# Patient Record
Sex: Female | Born: 1960
Health system: Southern US, Community
[De-identification: ages and names within clinical notes are randomized; demographics above are authoritative.]

## PROBLEM LIST (undated history)

## (undated) DIAGNOSIS — E785 Hyperlipidemia, unspecified: Secondary | ICD-10-CM

## (undated) DIAGNOSIS — M79609 Pain in unspecified limb: Secondary | ICD-10-CM

## (undated) DIAGNOSIS — D509 Iron deficiency anemia, unspecified: Secondary | ICD-10-CM

## (undated) DIAGNOSIS — R740 Nonspecific elevation of levels of transaminase and lactic acid dehydrogenase [LDH]: Secondary | ICD-10-CM

## (undated) DIAGNOSIS — J309 Allergic rhinitis, unspecified: Secondary | ICD-10-CM

## (undated) HISTORY — DX: Allergic rhinitis, unspecified: J30.9

## (undated) HISTORY — DX: Nonspecific elevation of levels of transaminase and lactic acid dehydrogenase (ldh): R74.0

## (undated) HISTORY — DX: Pain in unspecified limb: M79.609

## (undated) HISTORY — DX: Hyperlipidemia, unspecified: E78.5

## (undated) HISTORY — DX: Iron deficiency anemia, unspecified: D50.9

---

## 1992-04-12 HISTORY — PX: OTHER SURGICAL HISTORY: SHX169

## 1996-04-12 HISTORY — PX: OOPHORECTOMY: SHX86

## 2004-04-04 ENCOUNTER — Emergency Department (HOSPITAL_COMMUNITY): Admission: EM | Admit: 2004-04-04 | Discharge: 2004-04-04 | Payer: Self-pay | Admitting: Family Medicine

## 2005-09-15 ENCOUNTER — Other Ambulatory Visit: Admission: RE | Admit: 2005-09-15 | Discharge: 2005-09-15 | Payer: Self-pay | Admitting: Obstetrics and Gynecology

## 2006-06-06 ENCOUNTER — Ambulatory Visit: Payer: Self-pay | Admitting: Internal Medicine

## 2006-06-06 LAB — CONVERTED CEMR LAB
Alkaline Phosphatase: 65 units/L (ref 39–117)
BUN: 11 mg/dL (ref 6–23)
Basophils Relative: 0.4 % (ref 0.0–1.0)
Bilirubin Urine: NEGATIVE
CO2: 28 meq/L (ref 19–32)
Calcium: 9.5 mg/dL (ref 8.4–10.5)
Eosinophils Absolute: 0.2 10*3/uL (ref 0.0–0.6)
Eosinophils Relative: 4.2 % (ref 0.0–5.0)
GFR calc Af Amer: 62 mL/min
GFR calc non Af Amer: 51 mL/min
Hemoglobin, Urine: NEGATIVE
Hemoglobin: 12.6 g/dL (ref 12.0–15.0)
Lymphocytes Relative: 45.1 % (ref 12.0–46.0)
MCHC: 32.9 g/dL (ref 30.0–36.0)
MCV: 87.1 fL (ref 78.0–100.0)
Monocytes Absolute: 0.4 10*3/uL (ref 0.2–0.7)
Monocytes Relative: 10.9 % (ref 3.0–11.0)
Neutrophils Relative %: 39.4 % — ABNORMAL LOW (ref 43.0–77.0)
Nitrite: NEGATIVE
Potassium: 4.9 meq/L (ref 3.5–5.1)
TSH: 2 microintl units/mL (ref 0.35–5.50)
Total CHOL/HDL Ratio: 3.2
WBC: 3.9 10*3/uL — ABNORMAL LOW (ref 4.5–10.5)

## 2006-06-10 ENCOUNTER — Ambulatory Visit: Payer: Self-pay | Admitting: Internal Medicine

## 2009-10-22 ENCOUNTER — Ambulatory Visit: Payer: Self-pay | Admitting: Internal Medicine

## 2009-10-22 DIAGNOSIS — D509 Iron deficiency anemia, unspecified: Secondary | ICD-10-CM

## 2009-10-22 DIAGNOSIS — R740 Nonspecific elevation of levels of transaminase and lactic acid dehydrogenase [LDH]: Secondary | ICD-10-CM

## 2009-10-22 DIAGNOSIS — M79609 Pain in unspecified limb: Secondary | ICD-10-CM

## 2009-10-22 DIAGNOSIS — R7401 Elevation of levels of liver transaminase levels: Secondary | ICD-10-CM

## 2009-10-22 HISTORY — DX: Iron deficiency anemia, unspecified: D50.9

## 2009-10-22 HISTORY — DX: Elevation of levels of liver transaminase levels: R74.01

## 2009-10-22 HISTORY — DX: Pain in unspecified limb: M79.609

## 2009-10-28 ENCOUNTER — Encounter: Payer: Self-pay | Admitting: Internal Medicine

## 2009-10-30 ENCOUNTER — Encounter: Admission: RE | Admit: 2009-10-30 | Discharge: 2009-10-30 | Payer: Self-pay | Admitting: Internal Medicine

## 2010-05-03 ENCOUNTER — Encounter: Payer: Self-pay | Admitting: Obstetrics and Gynecology

## 2010-05-10 LAB — CONVERTED CEMR LAB
ALT: 20 units/L (ref 0–35)
AST: 22 units/L (ref 0–37)
Albumin: 3.4 g/dL — ABNORMAL LOW (ref 3.5–5.2)
Alkaline Phosphatase: 63 units/L (ref 39–117)
Bilirubin Urine: NEGATIVE
CO2: 26 meq/L (ref 19–32)
Creatinine, Ser: 1.1 mg/dL (ref 0.4–1.2)
Eosinophils Absolute: 0.1 10*3/uL (ref 0.0–0.7)
GFR calc non Af Amer: 71.48 mL/min (ref >60)
HCT: 41 % (ref 36.0–46.0)
Hep A IgM: NEGATIVE
Hep B C IgM: NEGATIVE
Ketones, ur: NEGATIVE mg/dL
LDL Cholesterol: 96 mg/dL (ref 0–99)
Lymphs Abs: 2.2 10*3/uL (ref 0.7–4.0)
MCHC: 33.8 g/dL (ref 30.0–36.0)
MCV: 87 fL (ref 78.0–100.0)
Monocytes Absolute: 0.3 10*3/uL (ref 0.1–1.0)
Sodium: 141 meq/L (ref 135–145)
TSH: 0.7 microintl units/mL (ref 0.35–5.50)
Total Protein: 5.5 g/dL — ABNORMAL LOW (ref 6.0–8.3)
Triglycerides: 54 mg/dL (ref 0.0–149.0)
Urine Glucose: NEGATIVE mg/dL
VLDL: 10.8 mg/dL (ref 0.0–40.0)

## 2010-05-12 NOTE — Assessment & Plan Note (Signed)
Summary: NEW PT CPX/ TO COME FASTING / LOV 05-2006 /NWS   Vital Signs:  Patient profile:   50 year old female Height:      68 inches Weight:      147 pounds BMI:     22.43 O2 Sat:      99 % on Room air Temp:     98.3 degrees F oral Pulse rate:   77 / minute BP sitting:   100 / 78  (left arm) Cuff size:   regular  Vitals Entered By: Zella Ball Ewing CMA Duncan Dull) (October 22, 2009 9:23 AM)  O2 Flow:  Room air  CC: Adult Physical/RE   CC:  Adult Physical/RE.  History of Present Illness: overall doing well, Pt denies CP, sob, doe, wheezing, orthopnea, pnd, worsening LE edema, palps, dizziness or syncope  Pt denies new neuro symptoms such as headache, facial or extremity weakness  Wants to quit smoking.  Preventive Screening-Counseling & Management  Alcohol-Tobacco     Smoking Status: current      Drug Use:  no.    Problems Prior to Update: 1)  Preventive Health Care  (ICD-V70.0) 2)  Thumb Pain, Right  (ICD-729.5) 3)  Transaminases, Serum, Elevated  (ICD-790.4) 4)  Anemia-iron Deficiency  (ICD-280.9)  Medications Prior to Update: 1)  None  Current Medications (verified): 1)  Chantix Starting Month Pak 0.5 Mg X 11 & 1 Mg X 42 Tabs (Varenicline Tartrate) .... Use Asd 1 By Mouth Once Daily  Allergies (verified): No Known Drug Allergies  Past History:  Family History: Last updated: 10/22/2009 brother with lung cancer father with esophageal cancer mother and sister with DM, HTN  Social History: Last updated: 10/22/2009 Divorced no children work - Manufacturing systems engineer Current Smoker Alcohol use-yes Drug use-no  Risk Factors: Smoking Status: current (10/22/2009)  Past Medical History: Anemia-iron deficiency LMP feb 2008 - early menopause  Past Surgical History: Oophorectomy - right only , 1998 s/p fibroid surgury (no TAH)  1994  Family History: Reviewed history and no changes required. brother with lung cancer father with esophageal cancer mother and  sister with DM, HTN  Social History: Reviewed history and no changes required. Divorced no children work - Manufacturing systems engineer Current Smoker Alcohol use-yes Drug use-no Smoking Status:  current Drug Use:  no  Review of Systems  The patient denies anorexia, fever, weight loss, weight gain, vision loss, decreased hearing, hoarseness, chest pain, syncope, dyspnea on exertion, peripheral edema, prolonged cough, headaches, hemoptysis, abdominal pain, melena, hematochezia, severe indigestion/heartburn, hematuria, muscle weakness, suspicious skin lesions, transient blindness, difficulty walking, depression, unusual weight change, abnormal bleeding, enlarged lymph nodes, and angioedema.         all otherwise negative per pt -  except for pain with the right thumb DIP  - seems to lock and then hurts to put it back into place ;  cant flex the DIP for one month; also has occasional tingling of the fingers to both hands at work  Physical Exam  General:  alert and well-developed.   Head:  normocephalic and atraumatic.   Eyes:  vision grossly intact, pupils equal, and pupils round.   Ears:  R ear normal and L ear normal.   Nose:  no external deformity and no nasal discharge.   Mouth:  no gingival abnormalities and pharynx pink and moist.   Neck:  supple and no masses.   Lungs:  normal respiratory effort and normal breath sounds.   Heart:  normal rate and regular rhythm.   Abdomen:  soft, non-tender, and normal bowel sounds.   Msk:  no joint tenderness and no joint swelling. , l;eft thumb with unable to flex the DIP  Extremities:  no edema, no erythema  Neurologic:  cranial nerves II-XII intact and strength normal in all extremities.   Skin:  color normal and no rashes.   Psych:  memory intact for recent and remote and normally interactive.     Impression & Recommendations:  Problem # 1:  Preventive Health Care (ICD-V70.0)  Overall doing well, age appropriate education and counseling  updated and referral for appropriate preventive services done unless declined, immunizations up to date or declined, diet counseling done if overweight, urged to quit smoking if smokes , most recent labs reviewed and current ordered if appropriate, ecg reviewed or declined (interpretation per ECG scanned in the EMR if done); information regarding Medicare Prevention requirements given if appropriate; speciality referrals updated as appropriate   Orders: TLB-BMP (Basic Metabolic Panel-BMET) (80048-METABOL) TLB-CBC Platelet - w/Differential (85025-CBCD) TLB-Hepatic/Liver Function Pnl (80076-HEPATIC) TLB-Lipid Panel (80061-LIPID) TLB-TSH (Thyroid Stimulating Hormone) (84443-TSH) TLB-Udip ONLY (81003-UDIP)  Problem # 2:  TRANSAMINASES, SERUM, ELEVATED (ICD-790.4)  mild with 2008 labs - to also check hepatitis profilek, including hep c  Orders: T-Hepatitis Acute Panel (35573-22025)  Problem # 3:  THUMB PAIN, RIGHT (ICD-729.5)  ok to refer to hand surgeon  Orders: Orthopedic Surgeon Referral (Ortho Surgeon)  Complete Medication List: 1)  Chantix Starting Month Pak 0.5 Mg X 11 & 1 Mg X 42 Tabs (Varenicline tartrate) .... Use asd 1 by mouth once daily  Other Orders: Tdap => 46yrs IM (42706) Admin 1st Vaccine (23762) EKG w/ Interpretation (93000)   Patient Instructions: 1)  please stop smoking 2)  Please take all new medications as prescribed - the chantix 3)  you had tetanus shot today 4)  please call for yearly mammogram - consider [facility] on [address], or [facility] on [address] 5)  Please go to the Lab in the basement for your blood and/or urine tests today  6)  plesae call the number on the blue card for results 7)  You will be contacted about the referral(s) to: Hand surgeon  8)  Please schedule a follow-up appointment in 1 year or sooner if needed Prescriptions: CHANTIX STARTING MONTH PAK 0.5 MG X 11 & 1 MG X 42 TABS (VARENICLINE TARTRATE) use asd 1 by mouth once daily   #1pk x 0   Entered and Authorized by:   Corwin Levins MD   Signed by:   Corwin Levins MD on 10/22/2009   Method used:   Print then Give to Patient   RxID:   [id number] CHANTIX CONTINUING MONTH PAK 1 MG TABS (VARENICLINE TARTRATE) use asd 1 by mouth once daily  #30 x 1   Entered and Authorized by:   Corwin Levins MD   Signed by:   Corwin Levins MD on 10/22/2009   Method used:   Print then Give to Patient   RxID:   [id number] CHANTIX STARTING MONTH PAK 0.5 MG X 11 & 1 MG X 42 TABS (VARENICLINE TARTRATE) use asd 1 by mouth once daily  #30 x 0   Entered and Authorized by:   Corwin Levins MD   Signed by:   Corwin Levins MD on 10/22/2009   Method used:   Print then Give to Patient   RxID:   [id number]    Immunizations Administered:  Tetanus Vaccine:    Vaccine Type: Tdap    Site: left deltoid    Mfr: GlaxoSmithKline    Dose: 0.5 ml    Route: IM    Given by: Zella Ball Ewing CMA (AAMA)    Exp. Date: 07/04/2011    Lot #: [id number]    VIS given: 02/28/07 version given October 22, 2009.

## 2010-05-12 NOTE — Letter (Signed)
Summary: [name]   Imported By: Sherian Rein 11/03/2009 11:37:40  _____________________________________________________________________  External Attachment:    Type:   Image     Comment:   External Document

## 2010-11-24 ENCOUNTER — Ambulatory Visit: Payer: Self-pay

## 2010-11-24 DIAGNOSIS — Z0000 Encounter for general adult medical examination without abnormal findings: Secondary | ICD-10-CM

## 2010-11-24 LAB — BASIC METABOLIC PANEL
CO2: 27 mEq/L (ref 19–32)
Calcium: 9.2 mg/dL (ref 8.4–10.5)
Chloride: 106 mEq/L (ref 96–112)
Sodium: 141 mEq/L (ref 135–145)

## 2010-11-24 LAB — CBC WITH DIFFERENTIAL/PLATELET
Basophils Absolute: 0 10*3/uL (ref 0.0–0.1)
Basophils Relative: 0.4 % (ref 0.0–3.0)
Eosinophils Absolute: 0.2 10*3/uL (ref 0.0–0.7)
HCT: 38.2 % (ref 36.0–46.0)
Lymphs Abs: 3.3 10*3/uL (ref 0.7–4.0)
MCHC: 33.2 g/dL (ref 30.0–36.0)
Monocytes Absolute: 0.5 10*3/uL (ref 0.1–1.0)
Monocytes Relative: 5.7 % (ref 3.0–12.0)
Neutro Abs: 4.8 10*3/uL (ref 1.4–7.7)
Neutrophils Relative %: 54.4 % (ref 43.0–77.0)
WBC: 8.9 10*3/uL (ref 4.5–10.5)

## 2010-11-24 LAB — HEPATIC FUNCTION PANEL
ALT: 21 U/L (ref 0–35)
Total Bilirubin: 0.5 mg/dL (ref 0.3–1.2)

## 2010-11-24 LAB — LIPID PANEL
HDL: 53.1 mg/dL (ref >39.00)
LDL Cholesterol: 89 mg/dL (ref 0–99)

## 2010-11-24 LAB — URINALYSIS, ROUTINE W REFLEX MICROSCOPIC
Bilirubin Urine: NEGATIVE
Ketones, ur: NEGATIVE
Specific Gravity, Urine: 1.02 (ref 1.000–1.030)
Urobilinogen, UA: 0.2 (ref 0.0–1.0)

## 2010-11-29 ENCOUNTER — Encounter: Payer: Self-pay | Admitting: Internal Medicine

## 2010-11-29 DIAGNOSIS — Z0000 Encounter for general adult medical examination without abnormal findings: Secondary | ICD-10-CM | POA: Insufficient documentation

## 2010-11-30 ENCOUNTER — Encounter: Payer: Self-pay | Admitting: Internal Medicine

## 2010-11-30 DIAGNOSIS — Z029 Encounter for administrative examinations, unspecified: Secondary | ICD-10-CM

## 2010-12-11 ENCOUNTER — Telehealth: Payer: Self-pay

## 2010-12-11 NOTE — Telephone Encounter (Signed)
Patient dropped wellness form for insurance to be completed by MD. Did complete form tried to call patient to pickup at front desk but mailbox was full and work number was incorrect. Copied form sent to scan and mailed original to patients home.

## 2010-12-17 ENCOUNTER — Other Ambulatory Visit: Payer: Self-pay | Admitting: Internal Medicine

## 2010-12-17 DIAGNOSIS — Z1231 Encounter for screening mammogram for malignant neoplasm of breast: Secondary | ICD-10-CM

## 2010-12-29 ENCOUNTER — Ambulatory Visit
Admission: RE | Admit: 2010-12-29 | Discharge: 2010-12-29 | Disposition: A | Discharge status: Home / Self Care | Payer: BC Managed Care – PPO | Source: Ambulatory Visit | Attending: Internal Medicine | Admitting: Internal Medicine

## 2010-12-29 DIAGNOSIS — Z1231 Encounter for screening mammogram for malignant neoplasm of breast: Secondary | ICD-10-CM

## 2010-12-31 ENCOUNTER — Ambulatory Visit (INDEPENDENT_AMBULATORY_CARE_PROVIDER_SITE_OTHER): Payer: BC Managed Care – PPO | Admitting: Internal Medicine

## 2010-12-31 ENCOUNTER — Encounter: Payer: Self-pay | Admitting: Internal Medicine

## 2010-12-31 VITALS — BP 100/62 | HR 75 | Temp 98.8°F | Ht 68.0 in | Wt 149.4 lb

## 2010-12-31 DIAGNOSIS — Z0000 Encounter for general adult medical examination without abnormal findings: Secondary | ICD-10-CM

## 2010-12-31 DIAGNOSIS — M549 Dorsalgia, unspecified: Secondary | ICD-10-CM

## 2010-12-31 DIAGNOSIS — J309 Allergic rhinitis, unspecified: Secondary | ICD-10-CM

## 2010-12-31 MED ORDER — FLUTICASONE PROPIONATE 50 MCG/ACT NA SUSP: 2.0000 | Freq: Every day | NASAL | Status: DC

## 2010-12-31 MED ORDER — FEXOFENADINE HCL 180 MG PO TABS: 180.0000 mg | ORAL_TABLET | Freq: Every day | ORAL | Status: DC

## 2010-12-31 NOTE — Patient Instructions (Signed)
Take all new medications as prescribed Continue all other medications as before, including advil for your left lower back Please call [employer] to find out your Copay cost for a Screening Colonoscopy at [facility] You will be contacted regarding the referral for: colonoscopy Please return in 1 year for your yearly visit, or sooner if needed, with Lab testing done 3-5 days before

## 2011-01-02 ENCOUNTER — Encounter: Payer: Self-pay | Admitting: Internal Medicine

## 2011-01-02 DIAGNOSIS — M549 Dorsalgia, unspecified: Secondary | ICD-10-CM | POA: Insufficient documentation

## 2011-01-02 DIAGNOSIS — J309 Allergic rhinitis, unspecified: Secondary | ICD-10-CM

## 2011-01-02 HISTORY — DX: Allergic rhinitis, unspecified: J30.9

## 2011-01-02 NOTE — Progress Notes (Signed)
Subjective:    Patient ID: Mary Branch, female    DOB: 1961-01-05, 50 y.o.   MRN: [id number]  HPI  Here for wellness and f/u;  Overall doing ok;  Pt denies CP, worsening SOB, DOE, wheezing, orthopnea, PND, worsening LE edema, palpitations, dizziness or syncope.  Pt denies neurological change such as new Headache, facial or extremity weakness.  Pt denies polydipsia, polyuria, or low sugar symptoms. Pt states overall good compliance with treatment and medications, good tolerability, and trying to follow lower cholesterol diet.  Pt denies worsening depressive symptoms, suicidal ideation or panic. No fever, wt loss, night sweats, loss of appetite, or other constitutional symptoms.  Pt states good ability with ADL's, low fall risk, home safety reviewed and adequate, no significant changes in hearing or vision, and occasionally active with exercise.  Does have several wks ongoing nasal allergy symptoms with clear congestion, itch and sneeze, without fever, pain, ST, cough or wheezing.  Also with mild stiffness of the left lower back in the past 2 days, mild without radicular pain, or LE weakness/numbness, worse to standing up. Past Medical History  Diagnosis Date  . ANEMIA-IRON DEFICIENCY 10/22/2009  . THUMB PAIN, RIGHT 10/22/2009  . TRANSAMINASES, SERUM, ELEVATED 10/22/2009   Past Surgical History  Procedure Date  . Oophorectomy 1998    right  . S/op fibroid surgury 1994    No TAH    reports that she has been smoking.  She does not have any smokeless tobacco history on file. She reports that she drinks alcohol. She reports that she does not use illicit drugs. family history includes Cancer in her brother and father; Diabetes in her mother and sister; and Hypertension in her mother and sister. No Known Allergies No current outpatient prescriptions on file prior to visit.   Review of Systems Review of Systems  Constitutional: Negative for diaphoresis, activity change, appetite change and  unexpected weight change.  HENT: Negative for hearing loss, ear pain, facial swelling, mouth sores and neck stiffness.   Eyes: Negative for pain, redness and visual disturbance.  Respiratory: Negative for shortness of breath and wheezing.   Cardiovascular: Negative for chest pain and palpitations.  Gastrointestinal: Negative for diarrhea, blood in stool, abdominal distention and rectal pain.  Genitourinary: Negative for hematuria, flank pain and decreased urine volume.  Musculoskeletal: Negative for myalgias and joint swelling.  Skin: Negative for color change and wound.  Neurological: Negative for syncope and numbness.  Hematological: Negative for adenopathy.  Psychiatric/Behavioral: Negative for hallucinations, self-injury, decreased concentration and agitation.      Objective:   Physical Exam BP 100/62  Pulse 75  Temp(Src) 98.8 F (37.1 C) (Oral)  Ht 5\' 8"  (1.727 m)  Wt 149 lb 6 oz (67.756 kg)  BMI 22.71 kg/m2  SpO2 98% Physical Exam  VS noted Constitutional: Pt is oriented to person, place, and time. Appears well-developed and well-nourished.  HENT:  Head: Normocephalic and atraumatic.  Right Ear: External ear normal.  Left Ear: External ear normal.  Nose: Nose normal.  Mouth/Throat: Oropharynx is clear and moist.  Bilat tm's mild erythema.  Sinus nontender.  Pharynx mild erythema Eyes: Conjunctivae and EOM are normal. Pupils are equal, round, and reactive to light.  Neck: Normal range of motion. Neck supple. No JVD present. No tracheal deviation present.  Cardiovascular: Normal rate, regular rhythm, normal heart sounds and intact distal pulses.   Pulmonary/Chest: Effort normal and breath sounds normal.  Abdominal: Soft. Bowel sounds are normal. There is no tenderness.  Musculoskeletal: Normal range of motion. Exhibits no edema.  Lymphadenopathy:  Has no cervical adenopathy.  Neurological: Pt is alert and oriented to person, place, and time. Pt has normal reflexes. No  cranial nerve deficit.  Skin: Skin is warm and dry. No rash noted.  Psychiatric:  Has  normal mood and affect. Behavior is normal.  MSK: mild left lumbar paravertebral tender only with spasm, no spine tenderness       Assessment & Plan:

## 2011-01-02 NOTE — Assessment & Plan Note (Signed)

## 2011-01-02 NOTE — Assessment & Plan Note (Signed)
Mild to mod, for allegra/flonase,  to f/u any worsening symptoms or concerns 

## 2011-01-02 NOTE — Assessment & Plan Note (Signed)
Exam c/w mild MSK strain, for advil otc prn,  to f/u any worsening symptoms or concerns

## 2011-03-09 ENCOUNTER — Encounter: Payer: Self-pay | Admitting: Gastroenterology

## 2012-02-14 ENCOUNTER — Other Ambulatory Visit: Payer: Self-pay | Admitting: Internal Medicine

## 2012-02-14 DIAGNOSIS — Z1231 Encounter for screening mammogram for malignant neoplasm of breast: Secondary | ICD-10-CM

## 2012-03-23 ENCOUNTER — Ambulatory Visit
Admission: RE | Admit: 2012-03-23 | Discharge: 2012-03-23 | Disposition: A | Discharge status: Home / Self Care | Payer: BC Managed Care – PPO | Source: Ambulatory Visit | Attending: Internal Medicine | Admitting: Internal Medicine

## 2012-03-23 DIAGNOSIS — Z1231 Encounter for screening mammogram for malignant neoplasm of breast: Secondary | ICD-10-CM

## 2012-03-30 ENCOUNTER — Other Ambulatory Visit: Payer: Self-pay | Admitting: Internal Medicine

## 2012-03-30 DIAGNOSIS — R928 Other abnormal and inconclusive findings on diagnostic imaging of breast: Secondary | ICD-10-CM

## 2012-04-10 ENCOUNTER — Ambulatory Visit
Admission: RE | Admit: 2012-04-10 | Discharge: 2012-04-10 | Disposition: A | Discharge status: Home / Self Care | Payer: BC Managed Care – PPO | Source: Ambulatory Visit | Attending: Internal Medicine | Admitting: Internal Medicine

## 2012-04-10 DIAGNOSIS — R928 Other abnormal and inconclusive findings on diagnostic imaging of breast: Secondary | ICD-10-CM

## 2012-05-04 ENCOUNTER — Encounter: Payer: Self-pay | Admitting: Internal Medicine

## 2012-05-04 ENCOUNTER — Ambulatory Visit (INDEPENDENT_AMBULATORY_CARE_PROVIDER_SITE_OTHER): Payer: BC Managed Care – PPO | Admitting: Internal Medicine

## 2012-05-04 ENCOUNTER — Other Ambulatory Visit (INDEPENDENT_AMBULATORY_CARE_PROVIDER_SITE_OTHER): Payer: BC Managed Care – PPO

## 2012-05-04 VITALS — BP 122/80 | HR 73 | Temp 98.2°F | Ht 67.0 in | Wt 154.2 lb

## 2012-05-04 DIAGNOSIS — Z0000 Encounter for general adult medical examination without abnormal findings: Secondary | ICD-10-CM

## 2012-05-04 DIAGNOSIS — G5603 Carpal tunnel syndrome, bilateral upper limbs: Secondary | ICD-10-CM

## 2012-05-04 DIAGNOSIS — G5600 Carpal tunnel syndrome, unspecified upper limb: Secondary | ICD-10-CM

## 2012-05-04 LAB — CBC WITH DIFFERENTIAL/PLATELET
Basophils Relative: 0.4 % (ref 0.0–3.0)
Eosinophils Relative: 2.6 % (ref 0.0–5.0)
Monocytes Relative: 6.2 % (ref 3.0–12.0)
Neutrophils Relative %: 49.3 % (ref 43.0–77.0)
Platelets: 245 10*3/uL (ref 150.0–400.0)
RBC: 4.57 Mil/uL (ref 3.87–5.11)
WBC: 7.5 10*3/uL (ref 4.5–10.5)

## 2012-05-04 LAB — URINALYSIS, ROUTINE W REFLEX MICROSCOPIC
Ketones, ur: NEGATIVE
Specific Gravity, Urine: 1.015 (ref 1.000–1.030)
Total Protein, Urine: NEGATIVE
Urine Glucose: NEGATIVE
pH: 7.5 (ref 5.0–8.0)

## 2012-05-04 LAB — HEPATIC FUNCTION PANEL
ALT: 25 U/L (ref 0–35)
AST: 22 U/L (ref 0–37)
Albumin: 3.7 g/dL (ref 3.5–5.2)

## 2012-05-04 LAB — BASIC METABOLIC PANEL
BUN: 15 mg/dL (ref 6–23)
CO2: 28 mEq/L (ref 19–32)
Calcium: 9.7 mg/dL (ref 8.4–10.5)
Chloride: 107 mEq/L (ref 96–112)
Creatinine, Ser: 1.2 mg/dL (ref 0.4–1.2)
GFR: 58.95 mL/min — ABNORMAL LOW (ref >60.00)
Glucose, Bld: 83 mg/dL (ref 70–99)
Potassium: 4.3 mEq/L (ref 3.5–5.1)
Sodium: 141 mEq/L (ref 135–145)

## 2012-05-04 LAB — LIPID PANEL
Cholesterol: 195 mg/dL (ref 0–200)
Triglycerides: 75 mg/dL (ref 0.0–149.0)
VLDL: 15 mg/dL (ref 0.0–40.0)

## 2012-05-04 NOTE — Progress Notes (Signed)
Subjective:    Patient ID: Mary Branch, female    DOB: [birth year], 52 y.o.   MRN: [id number]  HPI  Here for wellness and f/u;  Overall doing ok;  Pt denies CP, worsening SOB, DOE, wheezing, orthopnea, PND, worsening LE edema, palpitations, dizziness or syncope.  Pt denies neurological change such as new headache, facial or extremity weakness.  Pt denies polydipsia, polyuria, or low sugar symptoms. Pt states overall good compliance with treatment and medications, good tolerability, and has been trying to follow lower cholesterol diet.  Pt denies worsening depressive symptoms, suicidal ideation or panic. No fever, night sweats, wt loss, loss of appetite, or other constitutional symptoms.  Pt states good ability with ADL's, has low fall risk, home safety reviewed and adequate, no other significant changes in hearing or vision, and only occasionally active with exercise.  Does have ongoing bilat hand numbness and intermittent discomfort, no weakenss/elbow or neck pain.  Did have red spot to right breast last wk that itched but now resolved Past Medical History  Diagnosis Date  . ANEMIA-IRON DEFICIENCY 10/22/2009  . THUMB PAIN, RIGHT 10/22/2009  . TRANSAMINASES, SERUM, ELEVATED 10/22/2009  . Allergic rhinitis, cause unspecified 01/02/2011   Past Surgical History  Procedure Date  . Oophorectomy 1998    right  . S/op fibroid surgury 1994    No TAH    reports that she has been smoking.  She does not have any smokeless tobacco history on file. She reports that she drinks alcohol. She reports that she does not use illicit drugs. family history includes Cancer in her brother and father; Diabetes in her mother and sister; and Hypertension in her mother and sister. No Known Allergies Current Outpatient Prescriptions on File Prior to Visit  Medication Sig Dispense Refill  . fexofenadine (ALLEGRA) 180 MG tablet Take 1 tablet (180 mg total) by mouth daily.  30 tablet  2  . fluticasone (FLONASE) 50 MCG/ACT  nasal spray Place 2 sprays into the nose daily.  16 g  2   Review of Systems Constitutional: Negative for diaphoresis, activity change, appetite change or unexpected weight change.  HENT: Negative for hearing loss, ear pain, facial swelling, mouth sores and neck stiffness.   Eyes: Negative for pain, redness and visual disturbance.  Respiratory: Negative for shortness of breath and wheezing.   Cardiovascular: Negative for chest pain and palpitations.  Gastrointestinal: Negative for diarrhea, blood in stool, abdominal distention or other pain Genitourinary: Negative for hematuria, flank pain or change in urine volume.  Musculoskeletal: Negative for myalgias and joint swelling.  Skin: Negative for color change and wound.  Neurological: Negative for syncope and numbness. other than noted Hematological: Negative for adenopathy.  Psychiatric/Behavioral: Negative for hallucinations, self-injury, decreased concentration and agitation.      Objective:   Physical Exam BP 122/80  Pulse 73  Temp 98.2 F (36.8 C) (Oral)  Ht 5\' 7"  (1.702 m)  Wt 154 lb 4 oz (69.967 kg)  BMI 24.16 kg/m2  SpO2 98% VS noted,  Constitutional: Pt is oriented to person, place, and time. Appears well-developed and well-nourished.  Head: Normocephalic and atraumatic.  Right Ear: External ear normal.  Left Ear: External ear normal.  Nose: Nose normal.  Mouth/Throat: Oropharynx is clear and moist.  Eyes: Conjunctivae and EOM are normal. Pupils are equal, round, and reactive to light.  Neck: Normal range of motion. Neck supple. No JVD present. No tracheal deviation present.  Cardiovascular: Normal rate, regular rhythm, normal heart sounds and intact distal  pulses.   Pulmonary/Chest: Effort normal and breath sounds normal.  Abdominal: Soft. Bowel sounds are normal. There is no tenderness. No HSM  Musculoskeletal: Normal range of motion. Exhibits no edema.  Lymphadenopathy:  Has no cervical adenopathy.  Neurological: Pt  is alert and oriented to person, place, and time. Pt has normal reflexes. No cranial nerve deficit. Motor/dtr/sens/gait intact Skin: Skin is warm and dry. No rash noted.  Psychiatric:  Has  normal mood and affect. Behavior is normal.     Assessment & Plan:

## 2012-05-04 NOTE — Patient Instructions (Addendum)
Your EKG was OK today Please wear the wrist splints at night to see if this helps the carpal tunnel Please remember to followup with your GYN for the yearly pap smear (consider [facility]), and/or mammogram as you do Please go to the LAB in the Basement (turn left off the elevator) for the tests to be done today You will be contacted by phone if any changes need to be made immediately.  Otherwise, you will receive a letter about your results with an explanation, but please check with MyChart first. Thank you for enrolling in MyChart. Please follow the instructions below to securely access your online medical record. MyChart allows you to send messages to your doctor, view your test results, renew your prescriptions, schedule appointments, and more. To Log into My Chart online, please go by [employer] or [employer] to [email or web address], or download the MyChart App from the [employer] of [employer] .  Your Username is: [email or web address] (pass; [place]) Please send a practice Message on Mychart later today. You will be contacted regarding the referral for: colonoscopy You are otherwise up to date with prevention measures today. Please return in 1 year for your yearly visit, or sooner if needed, with Lab testing done 3-5 days before

## 2012-05-04 NOTE — Assessment & Plan Note (Signed)

## 2012-05-06 ENCOUNTER — Encounter: Payer: Self-pay | Admitting: Internal Medicine

## 2012-05-06 DIAGNOSIS — G5603 Carpal tunnel syndrome, bilateral upper limbs: Secondary | ICD-10-CM | POA: Insufficient documentation

## 2012-05-06 NOTE — Assessment & Plan Note (Signed)
Clinical dx, for trial bilat wrist splints qhs only,  to f/u any worsening symptoms or concerns

## 2012-05-07 ENCOUNTER — Encounter: Payer: Self-pay | Admitting: Internal Medicine

## 2012-05-27 ENCOUNTER — Other Ambulatory Visit: Payer: Self-pay

## 2012-10-02 ENCOUNTER — Encounter: Payer: BC Managed Care – PPO | Admitting: Internal Medicine

## 2013-01-30 ENCOUNTER — Encounter: Payer: Self-pay | Admitting: Gastroenterology

## 2013-02-15 ENCOUNTER — Other Ambulatory Visit: Payer: Self-pay

## 2013-03-06 ENCOUNTER — Ambulatory Visit (AMBULATORY_SURGERY_CENTER): Payer: Self-pay

## 2013-03-06 VITALS — Ht 68.0 in | Wt 152.6 lb

## 2013-03-06 DIAGNOSIS — Z1211 Encounter for screening for malignant neoplasm of colon: Secondary | ICD-10-CM

## 2013-03-06 MED ORDER — NA SULFATE-K SULFATE-MG SULF 17.5-3.13-1.6 GM/177ML PO SOLN: ORAL | Status: DC

## 2013-03-12 ENCOUNTER — Encounter: Payer: Self-pay | Admitting: Gastroenterology

## 2013-03-20 ENCOUNTER — Encounter: Payer: BC Managed Care – PPO | Admitting: Gastroenterology

## 2013-05-17 ENCOUNTER — Telehealth: Payer: Self-pay | Admitting: Gastroenterology

## 2013-05-17 NOTE — Telephone Encounter (Signed)
no

## 2013-05-21 ENCOUNTER — Encounter: Payer: BC Managed Care – PPO | Admitting: Gastroenterology

## 2013-10-10 ENCOUNTER — Other Ambulatory Visit: Payer: Self-pay

## 2013-10-10 DIAGNOSIS — Z1231 Encounter for screening mammogram for malignant neoplasm of breast: Secondary | ICD-10-CM

## 2013-10-23 ENCOUNTER — Ambulatory Visit
Admission: RE | Admit: 2013-10-23 | Discharge: 2013-10-23 | Disposition: A | Discharge status: Home / Self Care | Payer: BC Managed Care – PPO | Source: Ambulatory Visit

## 2013-10-23 DIAGNOSIS — Z1231 Encounter for screening mammogram for malignant neoplasm of breast: Secondary | ICD-10-CM

## 2013-10-23 LAB — HM MAMMOGRAPHY

## 2013-11-05 ENCOUNTER — Encounter: Payer: Self-pay | Admitting: Family

## 2013-11-05 ENCOUNTER — Ambulatory Visit (INDEPENDENT_AMBULATORY_CARE_PROVIDER_SITE_OTHER): Payer: BC Managed Care – PPO | Admitting: Family

## 2013-11-05 VITALS — HR 79 | Temp 98.4°F | Wt 154.0 lb

## 2013-11-05 DIAGNOSIS — L259 Unspecified contact dermatitis, unspecified cause: Secondary | ICD-10-CM

## 2013-11-05 DIAGNOSIS — L299 Pruritus, unspecified: Secondary | ICD-10-CM

## 2013-11-05 MED ORDER — METHYLPREDNISOLONE 4 MG PO KIT: PACK | ORAL | Status: AC

## 2013-11-05 MED ORDER — METHYLPREDNISOLONE ACETATE 40 MG/ML IJ SUSP
80.0000 mg | Freq: Once | INTRAMUSCULAR | Status: AC
Start: 2013-11-05 — End: 2013-11-05
  Administered 2013-11-05: 80 mg via INTRAMUSCULAR

## 2013-11-05 NOTE — Progress Notes (Signed)
Subjective:    Patient ID: Mary Branch, female    DOB: 01/25/1961, 53 y.o.   MRN: [id number]  HPI 53 year old [place] American female, nonsmoker presents today with complaints of a rash all over her body x1 week. Describes it as itchy and has been using an anti-itch cream that did not work well. Reports doing yard work one week ago and noticed a rash thereafter.Rash started on the right arm and spread over the course of entire body. Denies any changes in detergents, soaps, lotions. No new foods.   Review of Systems  Constitutional: Negative.   Respiratory: Negative.   Cardiovascular: Negative.   Gastrointestinal: Negative.   Endocrine: Negative.   Genitourinary: Negative.   Musculoskeletal: Negative.   Neurological: Negative.   Hematological: Negative.   Psychiatric/Behavioral: Negative.    Past Medical History  Diagnosis Date  . ANEMIA-IRON DEFICIENCY 10/22/2009  . THUMB PAIN, RIGHT 10/22/2009  . TRANSAMINASES, SERUM, ELEVATED 10/22/2009  . Allergic rhinitis, cause unspecified 01/02/2011    History   Social History  . Marital Status: Legally Separated    Spouse Name: N/A    Number of Children: N/A  . Years of Education: N/A   Occupational History  . food Theatre manager    Social History Main Topics  . Smoking status: Former Smoker    Types: Cigarettes    Quit date: 03/01/2010  . Smokeless tobacco: Never Used  . Alcohol Use: 2.0 oz/week    4 drink(s) per week  . Drug Use: No  . Sexual Activity: Not on file   Other Topics Concern  . Not on file   Social History Narrative  . No narrative on file    Past Surgical History  Procedure Laterality Date  . Oophorectomy  1998    right  . S/op fibroid surgury  1994    No TAH    Family History  Problem Relation Age of Onset  . Diabetes Mother   . Hypertension Mother   . Cancer Father     esophageal cancer  . Diabetes Sister   . Hypertension Sister   . Cancer Brother     lung cancer  . Diabetes  Brother     Allergies  Allergen Reactions  . Latex     rash    Current Outpatient Prescriptions on File Prior to Visit  Medication Sig Dispense Refill  . fexofenadine (ALLEGRA) 180 MG tablet Take 180 mg by mouth as needed for allergies or rhinitis.      . Na Sulfate-K Sulfate-Mg Sulf (SUPREP BOWEL PREP) SOLN Suprep as directed / no substitutions  354 mL  0  . OVER THE COUNTER MEDICATION Natures Place Iron- Take one pill daily      . fexofenadine (ALLEGRA) 180 MG tablet Take 1 tablet (180 mg total) by mouth daily.  30 tablet  2  . fluticasone (FLONASE) 50 MCG/ACT nasal spray Place 2 sprays into the nose daily.  16 g  2   No current facility-administered medications on file prior to visit.    Pulse 79  Temp(Src) 98.4 F (36.9 C) (Oral)  Wt 154 lb (69.854 kg)chart    Objective:   Physical Exam  Constitutional: She is oriented to person, place, and time. She appears well-developed and well-nourished.  HENT:  Right Ear: External ear normal.  Left Ear: External ear normal.  Nose: Nose normal.  Mouth/Throat: Oropharynx is clear and moist.  Neck: Normal range of motion. Neck supple.  Cardiovascular: Normal rate, regular rhythm  and normal heart sounds.   Pulmonary/Chest: Effort normal and breath sounds normal.  Musculoskeletal: Normal range of motion.  Neurological: She is alert and oriented to person, place, and time.  Skin: Skin is warm and dry. Rash noted.  Generalized, papular rash noted over the body. No drainage or discharge. Urticarial in nature.  Psychiatric: She has a normal mood and affect.          Assessment & Plan:  Malachi BondsGloria was seen today for rash.  Diagnoses and associated orders for this visit:  Contact dermatitis - methylPREDNISolone acetate (DEPO-MEDROL) injection 80 mg; Inject 2 mLs (80 mg total) into the muscle once.  Pruritus  Other Orders - methylPREDNISolone (MEDROL DOSEPAK) 4 MG tablet; follow package directions   Call the office with any  questions or concerns. Recheck as scheduled and as needed.

## 2013-11-05 NOTE — Patient Instructions (Signed)

## 2013-11-05 NOTE — Progress Notes (Signed)
Pre visit review using our clinic review tool, if applicable. No additional management support is needed unless otherwise documented below in the visit note. 

## 2013-12-26 ENCOUNTER — Ambulatory Visit (INDEPENDENT_AMBULATORY_CARE_PROVIDER_SITE_OTHER): Payer: BC Managed Care – PPO | Admitting: Internal Medicine

## 2013-12-26 ENCOUNTER — Encounter: Payer: Self-pay | Admitting: Internal Medicine

## 2013-12-26 ENCOUNTER — Other Ambulatory Visit (INDEPENDENT_AMBULATORY_CARE_PROVIDER_SITE_OTHER): Payer: BC Managed Care – PPO

## 2013-12-26 VITALS — BP 110/72 | HR 91 | Temp 98.5°F | Ht 68.0 in | Wt 152.4 lb

## 2013-12-26 DIAGNOSIS — J018 Other acute sinusitis: Secondary | ICD-10-CM

## 2013-12-26 DIAGNOSIS — Z0000 Encounter for general adult medical examination without abnormal findings: Secondary | ICD-10-CM

## 2013-12-26 DIAGNOSIS — J309 Allergic rhinitis, unspecified: Secondary | ICD-10-CM

## 2013-12-26 DIAGNOSIS — J019 Acute sinusitis, unspecified: Secondary | ICD-10-CM | POA: Insufficient documentation

## 2013-12-26 MED ORDER — AZITHROMYCIN 250 MG PO TABS: ORAL_TABLET | ORAL | Status: DC

## 2013-12-26 MED ORDER — CETIRIZINE HCL 10 MG PO TABS: 10.0000 mg | ORAL_TABLET | Freq: Every day | ORAL | Status: DC

## 2013-12-26 NOTE — Assessment & Plan Note (Addendum)

## 2013-12-26 NOTE — Assessment & Plan Note (Signed)
Also for zyrtec prn,  to f/u any worsening symptoms or concerns, mucinex for left ear congestion

## 2013-12-26 NOTE — Progress Notes (Signed)
Subjective:    Patient ID: Mary Branch, female    DOB: 1960-06-22, 53 y.o.   MRN: [id number]  HPI  Here for wellness and f/u;  Overall doing ok;  Pt denies CP, worsening SOB, DOE, wheezing, orthopnea, PND, worsening LE edema, palpitations, dizziness or syncope.  Pt denies neurological change such as new headache, facial or extremity weakness.  Pt denies polydipsia, polyuria, or low sugar symptoms. Pt states overall good compliance with treatment and medications, good tolerability, and has been trying to follow lower cholesterol diet.  Pt denies worsening depressive symptoms, suicidal ideation or panic. No fever, night sweats, wt loss, loss of appetite, or other constitutional symptoms.  Pt states good ability with ADL's, has low fall risk, home safety reviewed and adequate, no other significant changes in hearing or vision, and only occasionally active with exercise.   Pt plans to call on her own to re-schedule her colonoscopy.  Incidentally -  Here with 2-3 days acute onset fever, facial pain, pressure, headache, general weakness and malaise, and greenish d/c, with mild ST and cough.  Due for GYN f/u as well. Declines flu shot  Past Medical History  Diagnosis Date  . ANEMIA-IRON DEFICIENCY 10/22/2009  . THUMB PAIN, RIGHT 10/22/2009  . TRANSAMINASES, SERUM, ELEVATED 10/22/2009  . Allergic rhinitis, cause unspecified 01/02/2011   Past Surgical History  Procedure Laterality Date  . Oophorectomy  1998    right  . S/op fibroid surgury  1994    No TAH    reports that she quit smoking about 3 years ago. Her smoking use included Cigarettes. She smoked 0.00 packs per day. She has never used smokeless tobacco. She reports that she drinks about 2 ounces of alcohol per week. She reports that she does not use illicit drugs. family history includes Cancer in her brother and father; Diabetes in her brother, mother, and sister; Hypertension in her mother and sister. Allergies  Allergen Reactions  .  Latex     rash   No current outpatient prescriptions on file prior to visit.   No current facility-administered medications on file prior to visit.   hReview of Systems Constitutional: Negative for increased diaphoresis, other activity, appetite or other siginficant weight change  HENT: Negative for worsening hearing loss, ear pain, facial swelling, mouth sores and neck stiffness.   Eyes: Negative for other worsening pain, redness or visual disturbance.  Respiratory: Negative for shortness of breath and wheezing.   Cardiovascular: Negative for chest pain and palpitations.  Gastrointestinal: Negative for diarrhea, blood in stool, abdominal distention or other pain Genitourinary: Negative for hematuria, flank pain or change in urine volume.  Musculoskeletal: Negative for myalgias or other joint complaints.  Skin: Negative for color change and wound.  Neurological: Negative for syncope and numbness. other than noted Hematological: Negative for adenopathy. or other swelling Psychiatric/Behavioral: Negative for hallucinations, self-injury, decreased concentration or other worsening agitation.      Objective:   Physical Exam BP 110/72  Pulse 91  Temp(Src) 98.5 F (36.9 C) (Oral)  Ht  (1.727 m)  Wt 152 lb 6 oz (69.117 kg)  BMI 23.17 kg/m2  SpO2 95% VS noted,  Constitutional: Pt is oriented to person, place, and time. Appears well-developed and well-nourished.  Head: Normocephalic and atraumatic.  Right Ear: External ear normal.  Left Ear: External ear normal.  Nose: Nose normal.  Mouth/Throat: Oropharynx is clear and moist.  Bilat tm's with mild erythema.  Max sinus areas mild tender.  Pharynx with mild  erythema, no exudate Eyes: Conjunctivae and EOM are normal. Pupils are equal, round, and reactive to light.  Neck: Normal range of motion. Neck supple. No JVD present. No tracheal deviation present.  Cardiovascular: Normal rate, regular rhythm, normal heart sounds and intact  distal pulses.   Pulmonary/Chest: Effort normal and breath sounds without rales or wheezing  Abdominal: Soft. Bowel sounds are normal. NT. No HSM  Musculoskeletal: Normal range of motion. Exhibits no edema.  Lymphadenopathy:  Has no cervical adenopathy.  Neurological: Pt is alert and oriented to person, place, and time. Pt has normal reflexes. No cranial nerve deficit. Motor grossly intact Skin: Skin is warm and dry. No rash noted.  Psychiatric:  Has normal mood and affect. Behavior is normal.     Assessment & Plan:

## 2013-12-26 NOTE — Patient Instructions (Signed)
Please call to re-schedule your colonoscopy as you mentioned  Please take all new medication as prescribed - the antibiotic, zyrtec for allergies  You can also take Mucinex (or it's generic off brand) for congestion, and tylenol as needed for pain.  Please continue all other medications as before  Please have the pharmacy call with any other refills you may need.  Please continue your efforts at being more active, low cholesterol diet, and weight control.  You are otherwise up to date with prevention measures today.  You will be contacted regarding the referral for: GYN  Please keep your appointments with your specialists as you may have planned  Please go to the LAB in the Basement (turn left off the elevator) for the tests to be done today  You will be contacted by phone if any changes need to be made immediately.  Otherwise, you will receive a letter about your results with an explanation, but please check with MyChart first.  Please remember to sign up for MyChart if you have not done so, as this will be important to you in the future with finding out test results, communicating by private email, and scheduling acute appointments online when needed.  Please return in 1 year for your yearly visit, or sooner if needed, with Lab testing done 3-5 days before

## 2013-12-26 NOTE — Progress Notes (Signed)
Pre visit review using our clinic review tool, if applicable. No additional management support is needed unless otherwise documented below in the visit note. 

## 2013-12-26 NOTE — Assessment & Plan Note (Signed)
Mild to mod, for antibx course,  to f/u any worsening symptoms or concerns 

## 2013-12-27 LAB — CBC WITH DIFFERENTIAL/PLATELET
Basophils Absolute: 0 10*3/uL (ref 0.0–0.1)
Basophils Relative: 0.6 % (ref 0.0–3.0)
EOS PCT: 1.8 % (ref 0.0–5.0)
Eosinophils Absolute: 0.1 10*3/uL (ref 0.0–0.7)
HCT: 38.9 % (ref 36.0–46.0)
Hemoglobin: 12.6 g/dL (ref 12.0–15.0)
Lymphocytes Relative: 34.4 % (ref 12.0–46.0)
Lymphs Abs: 2.6 10*3/uL (ref 0.7–4.0)
MCHC: 32.4 g/dL (ref 30.0–36.0)
MCV: 86.3 fl (ref 78.0–100.0)
MONO ABS: 0.3 10*3/uL (ref 0.1–1.0)
Monocytes Relative: 4.2 % (ref 3.0–12.0)
NEUTROS PCT: 59 % (ref 43.0–77.0)
Neutro Abs: 4.5 10*3/uL (ref 1.4–7.7)
PLATELETS: 201 10*3/uL (ref 150.0–400.0)
RBC: 4.51 Mil/uL (ref 3.87–5.11)
RDW: 13.8 % (ref 11.5–15.5)
WBC: 7.7 10*3/uL (ref 4.0–10.5)

## 2013-12-27 LAB — BASIC METABOLIC PANEL
BUN: 21 mg/dL (ref 6–23)
CHLORIDE: 111 meq/L (ref 96–112)
CO2: 23 mEq/L (ref 19–32)
Calcium: 8.7 mg/dL (ref 8.4–10.5)
Creatinine, Ser: 1.4 mg/dL — ABNORMAL HIGH (ref 0.4–1.2)
GFR: 49.23 mL/min — AB (ref >60.00)
Glucose, Bld: 103 mg/dL — ABNORMAL HIGH (ref 70–99)
POTASSIUM: 3.8 meq/L (ref 3.5–5.1)
SODIUM: 140 meq/L (ref 135–145)

## 2013-12-27 LAB — URINALYSIS, ROUTINE W REFLEX MICROSCOPIC
Bilirubin Urine: NEGATIVE
HGB URINE DIPSTICK: NEGATIVE
KETONES UR: NEGATIVE
Leukocytes, UA: NEGATIVE
Nitrite: NEGATIVE
RBC / HPF: NONE SEEN (ref 0–?)
Specific Gravity, Urine: 1.02 (ref 1.000–1.030)
Total Protein, Urine: NEGATIVE
URINE GLUCOSE: NEGATIVE
Urobilinogen, UA: 0.2 (ref 0.0–1.0)
pH: 6.5 (ref 5.0–8.0)

## 2013-12-27 LAB — HEPATIC FUNCTION PANEL
ALK PHOS: 58 U/L (ref 39–117)
ALT: 29 U/L (ref 0–35)
AST: 25 U/L (ref 0–37)
Albumin: 3.3 g/dL — ABNORMAL LOW (ref 3.5–5.2)
BILIRUBIN DIRECT: 0 mg/dL (ref 0.0–0.3)
BILIRUBIN TOTAL: 0.2 mg/dL (ref 0.2–1.2)
Total Protein: 5.4 g/dL — ABNORMAL LOW (ref 6.0–8.3)

## 2013-12-27 LAB — LIPID PANEL
CHOL/HDL RATIO: 4
Cholesterol: 153 mg/dL (ref 0–200)
HDL: 40.4 mg/dL (ref >39.00)
LDL CALC: 93 mg/dL (ref 0–99)
NONHDL: 112.6
Triglycerides: 98 mg/dL (ref 0.0–149.0)
VLDL: 19.6 mg/dL (ref 0.0–40.0)

## 2013-12-27 LAB — TSH: TSH: 1.27 u[IU]/mL (ref 0.35–4.50)

## 2014-01-25 ENCOUNTER — Telehealth: Payer: Self-pay | Admitting: Internal Medicine

## 2014-01-25 MED ORDER — AZITHROMYCIN 250 MG PO TABS: ORAL_TABLET | ORAL | Status: DC

## 2014-01-25 NOTE — Telephone Encounter (Signed)
Antibiotic done erx 

## 2014-01-25 NOTE — Telephone Encounter (Signed)
Pt seen for sinus infection in Sept with Dr Jonny RuizJohn. Pt requesting refill if possible for abx. Please advise. [facility]/[place]

## 2014-01-25 NOTE — Telephone Encounter (Signed)
Called pt no answer LMOM rx sent to [facility]...Raechel Chute/lmb

## 2014-04-02 ENCOUNTER — Encounter: Payer: Self-pay | Admitting: Internal Medicine

## 2014-04-02 ENCOUNTER — Ambulatory Visit (INDEPENDENT_AMBULATORY_CARE_PROVIDER_SITE_OTHER): Payer: BC Managed Care – PPO | Admitting: Internal Medicine

## 2014-04-02 VITALS — BP 112/80 | HR 78 | Temp 98.2°F | Ht 68.0 in | Wt 156.4 lb

## 2014-04-02 DIAGNOSIS — G5602 Carpal tunnel syndrome, left upper limb: Secondary | ICD-10-CM

## 2014-04-02 DIAGNOSIS — G5603 Carpal tunnel syndrome, bilateral upper limbs: Secondary | ICD-10-CM

## 2014-04-02 DIAGNOSIS — J309 Allergic rhinitis, unspecified: Secondary | ICD-10-CM

## 2014-04-02 DIAGNOSIS — J018 Other acute sinusitis: Secondary | ICD-10-CM

## 2014-04-02 DIAGNOSIS — G5601 Carpal tunnel syndrome, right upper limb: Secondary | ICD-10-CM

## 2014-04-02 DIAGNOSIS — M79641 Pain in right hand: Secondary | ICD-10-CM

## 2014-04-02 DIAGNOSIS — M79642 Pain in left hand: Secondary | ICD-10-CM

## 2014-04-02 MED ORDER — DICLOFENAC SODIUM 1 % TD GEL: 4.0000 g | Freq: Four times a day (QID) | TRANSDERMAL | Status: DC

## 2014-04-02 MED ORDER — PREDNISONE 10 MG PO TABS: ORAL_TABLET | ORAL | Status: DC

## 2014-04-02 MED ORDER — AZITHROMYCIN 250 MG PO TABS: ORAL_TABLET | ORAL | Status: DC

## 2014-04-02 NOTE — Patient Instructions (Signed)
Please take all new medication as prescribed - the antibiotic, pain gel, and prednisone  Please continue all other medications as before, and refills have been done if requested.  Please have the pharmacy call with any other refills you may need.  Please keep your appointments with your specialists as you may have planned

## 2014-04-02 NOTE — Progress Notes (Signed)
Subjective:    Patient ID: Mary Branch, female    DOB: 01-Jun-1960, 53 y.o.   MRN: [id number]  HPI   Here with 2-3 days acute onset fever, facial pain, pressure, headache, general weakness and malaise, and greenish d/c, with mild ST and cough, but pt denies chest pain, wheezing, increased sob or doe, orthopnea, PND, increased LE swelling, palpitations, dizziness or syncope. 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  Bilat wrist splints not helping this pain, has mild to mod diffuse burning discomfort for several wks to bilat palms the worst. Does have several wks ongoing nasal allergy symptoms with clearish congestion, itch and sneezing, without fever, pain, ST, cough, swelling or wheezing. Past Medical History  Diagnosis Date  . ANEMIA-IRON DEFICIENCY 10/22/2009  . THUMB PAIN, RIGHT 10/22/2009  . TRANSAMINASES, SERUM, ELEVATED 10/22/2009  . Allergic rhinitis, cause unspecified 01/02/2011   Past Surgical History  Procedure Laterality Date  . Oophorectomy  1998    right  . S/op fibroid surgury  1994    No TAH    reports that she quit smoking about 4 years ago. Her smoking use included Cigarettes. She smoked 0.00 packs per day. She has never used smokeless tobacco. She reports that she drinks about 2.0 oz of alcohol per week. She reports that she does not use illicit drugs. family history includes Cancer in her brother and father; Diabetes in her brother, mother, and sister; Hypertension in her mother and sister. Allergies  Allergen Reactions  . Latex     rash   Current Outpatient Prescriptions on File Prior to Visit  Medication Sig Dispense Refill  . cetirizine (ZYRTEC) 10 MG tablet Take 1 tablet (10 mg total) by mouth daily. 30 tablet 11   No current facility-administered medications on file prior to visit.    Review of Systems  Constitutional: Negative for unusual diaphoresis or other sweats  HENT: Negative for ringing in ear Eyes:  Negative for double vision or worsening visual disturbance.  Respiratory: Negative for choking and stridor.   Gastrointestinal: Negative for vomiting or other signifcant bowel change Genitourinary: Negative for hematuria or decreased urine volume.  Musculoskeletal: Negative for other MSK pain or swelling Skin: Negative for color change and worsening wound.  Neurological: Negative for tremors and numbness other than noted  Psychiatric/Behavioral: Negative for decreased concentration or agitation other than above       Objective:   Physical Exam BP 112/80 mmHg  Pulse 78  Temp(Src) 98.2 F (36.8 C) (Oral)  Ht 5\' 8"  (1.727 m)  Wt 156 lb 6 oz (70.931 kg)  BMI 23.78 kg/m2  SpO2 97% VS noted, mild ill Constitutional: Pt appears well-developed, well-nourished.  HENT: Head: NCAT.  Right Ear: External ear normal.  Left Ear: External ear normal.  Eyes: . Pupils are equal, round, and reactive to light. Conjunctivae and EOM are normal Bilat tm's with mild erythema.  Max sinus areas mild tender.  Pharynx with mild erythema, no exudate Neck: Normal range of motion. Neck supple.  Cardiovascular: Normal rate and regular rhythm.   Pulmonary/Chest: Effort normal and breath sounds without rales or wheezing.  Neurological: Pt is alert. Not confused , motor grossly intact Skin: Skin is warm. No rash Psychiatric: Pt behavior is normal. No agitation.  Bilat diffuse hand puffiness/swelling noted with mild tender palmar areas without significant erythema, no ulcers , normal color and o/w neurovasc intact       Assessment & Plan:

## 2014-04-02 NOTE — Progress Notes (Signed)
Pre visit review using our clinic review tool, if applicable. No additional management support is needed unless otherwise documented below in the visit note. 

## 2014-04-11 NOTE — Assessment & Plan Note (Signed)
stable overall by history and exam, and pt to continue medical treatment as before,  to f/u any worsening symptoms or concerns 

## 2014-04-11 NOTE — Assessment & Plan Note (Signed)
Should also improve with predpac,  to f/u any worsening symptoms or concerns, continue all other such as otc allegra as well

## 2014-04-11 NOTE — Assessment & Plan Note (Signed)
C/w overuse, for voltaren gel, hand rest, predpac asd,  to f/u any worsening symptoms or concerns

## 2014-04-11 NOTE — Assessment & Plan Note (Signed)
Mild to mod, for antibx course,  to f/u any worsening symptoms or concerns 

## 2016-02-27 ENCOUNTER — Other Ambulatory Visit: Payer: Self-pay | Admitting: Internal Medicine

## 2016-02-27 DIAGNOSIS — Z1231 Encounter for screening mammogram for malignant neoplasm of breast: Secondary | ICD-10-CM

## 2016-03-18 ENCOUNTER — Ambulatory Visit: Payer: Self-pay

## 2016-04-08 ENCOUNTER — Encounter: Payer: Self-pay | Admitting: Internal Medicine

## 2016-04-08 ENCOUNTER — Other Ambulatory Visit (INDEPENDENT_AMBULATORY_CARE_PROVIDER_SITE_OTHER): Payer: BLUE CROSS/BLUE SHIELD

## 2016-04-08 ENCOUNTER — Ambulatory Visit (INDEPENDENT_AMBULATORY_CARE_PROVIDER_SITE_OTHER): Payer: BLUE CROSS/BLUE SHIELD | Admitting: Internal Medicine

## 2016-04-08 VITALS — BP 106/64 | HR 96 | Temp 98.6°F | Resp 14 | Ht 68.0 in | Wt 143.8 lb

## 2016-04-08 DIAGNOSIS — M6530 Trigger finger, unspecified finger: Secondary | ICD-10-CM

## 2016-04-08 DIAGNOSIS — Z0001 Encounter for general adult medical examination with abnormal findings: Secondary | ICD-10-CM

## 2016-04-08 DIAGNOSIS — R252 Cramp and spasm: Secondary | ICD-10-CM

## 2016-04-08 DIAGNOSIS — J018 Other acute sinusitis: Secondary | ICD-10-CM

## 2016-04-08 LAB — BASIC METABOLIC PANEL
BUN: 19 mg/dL (ref 6–23)
CALCIUM: 9.2 mg/dL (ref 8.4–10.5)
CO2: 30 meq/L (ref 19–32)
CREATININE: 1.21 mg/dL — AB (ref 0.40–1.20)
Chloride: 107 mEq/L (ref 96–112)
GFR: 59.19 mL/min — AB (ref >60.00)
GLUCOSE: 82 mg/dL (ref 70–99)
Potassium: 3.7 mEq/L (ref 3.5–5.1)
Sodium: 141 mEq/L (ref 135–145)

## 2016-04-08 LAB — URINALYSIS, ROUTINE W REFLEX MICROSCOPIC
BILIRUBIN URINE: NEGATIVE
Hgb urine dipstick: NEGATIVE
KETONES UR: NEGATIVE
LEUKOCYTES UA: NEGATIVE
NITRITE: NEGATIVE
PH: 6.5 (ref 5.0–8.0)
RBC / HPF: NONE SEEN (ref 0–?)
SPECIFIC GRAVITY, URINE: 1.015 (ref 1.000–1.030)
Total Protein, Urine: NEGATIVE
URINE GLUCOSE: NEGATIVE
UROBILINOGEN UA: 0.2 (ref 0.0–1.0)

## 2016-04-08 LAB — HEPATIC FUNCTION PANEL
ALBUMIN: 3.8 g/dL (ref 3.5–5.2)
ALK PHOS: 66 U/L (ref 39–117)
ALT: 23 U/L (ref 0–35)
AST: 18 U/L (ref 0–37)
Bilirubin, Direct: 0 mg/dL (ref 0.0–0.3)
TOTAL PROTEIN: 5.7 g/dL — AB (ref 6.0–8.3)
Total Bilirubin: 0.2 mg/dL (ref 0.2–1.2)

## 2016-04-08 LAB — LIPID PANEL
CHOL/HDL RATIO: 4
Cholesterol: 189 mg/dL (ref 0–200)
HDL: 49.3 mg/dL (ref >39.00)
LDL CALC: 120 mg/dL — AB (ref 0–99)
NONHDL: 139.53
Triglycerides: 99 mg/dL (ref 0.0–149.0)
VLDL: 19.8 mg/dL (ref 0.0–40.0)

## 2016-04-08 LAB — CBC WITH DIFFERENTIAL/PLATELET
BASOS ABS: 0 10*3/uL (ref 0.0–0.1)
Basophils Relative: 0.4 % (ref 0.0–3.0)
Eosinophils Absolute: 0.2 10*3/uL (ref 0.0–0.7)
Eosinophils Relative: 2.3 % (ref 0.0–5.0)
HCT: 40.7 % (ref 36.0–46.0)
HEMOGLOBIN: 13.8 g/dL (ref 12.0–15.0)
LYMPHS ABS: 2.9 10*3/uL (ref 0.7–4.0)
LYMPHS PCT: 28.8 % (ref 12.0–46.0)
MCHC: 33.8 g/dL (ref 30.0–36.0)
MCV: 84.7 fl (ref 78.0–100.0)
MONOS PCT: 4 % (ref 3.0–12.0)
Monocytes Absolute: 0.4 10*3/uL (ref 0.1–1.0)
NEUTROS PCT: 64.5 % (ref 43.0–77.0)
Neutro Abs: 6.4 10*3/uL (ref 1.4–7.7)
Platelets: 221 10*3/uL (ref 150.0–400.0)
RBC: 4.8 Mil/uL (ref 3.87–5.11)
RDW: 13.9 % (ref 11.5–15.5)
WBC: 10 10*3/uL (ref 4.0–10.5)

## 2016-04-08 LAB — TSH: TSH: 1.14 u[IU]/mL (ref 0.35–4.50)

## 2016-04-08 MED ORDER — CYCLOBENZAPRINE HCL 5 MG PO TABS: 5.0000 mg | ORAL_TABLET | Freq: Every evening | ORAL | 5 refills | Status: DC | PRN

## 2016-04-08 MED ORDER — AZITHROMYCIN 250 MG PO TABS: ORAL_TABLET | ORAL | 1 refills | Status: DC

## 2016-04-08 NOTE — Assessment & Plan Note (Signed)
Likely due to over walking daily, for b12 and mg supplements otc, also for flexeril 5 qhs prn,  to f/u any worsening symptoms or concerns

## 2016-04-08 NOTE — Patient Instructions (Signed)
Please take all new medication as prescribed - the antibiotic, and muscle relaxer if needed  Please continue all other medications as before, and refills have been done if requested.  Please have the pharmacy call with any other refills you may need.  Please continue your efforts at being more active, low cholesterol diet, and weight control.  You are otherwise up to date with prevention measures today.  You will be contacted regarding the referral for: Hand Surgury  Please keep your appointments with your specialists as you may have planned  Please go to the LAB in the Basement (turn left off the elevator) for the tests to be done today  You will be contacted by phone if any changes need to be made immediately.  Otherwise, you will receive a letter about your results with an explanation, but please check with MyChart first.  Please remember to sign up for MyChart if you have not done so, as this will be important to you in the future with finding out test results, communicating by private email, and scheduling acute appointments online when needed.  Please return in 1 year for your yearly visit, or sooner if needed, with Lab testing done 3-5 days before

## 2016-04-08 NOTE — Assessment & Plan Note (Addendum)
Mild to mod, for antibx course,  to f/u any worsening symptoms or concerns  In addition to the time spent performing CPE, I spent an additional 15 minutes face to face,in which greater than 50% of this time was spent in counseling and coordination of care for patient's illness as documented.   

## 2016-04-08 NOTE — Assessment & Plan Note (Signed)

## 2016-04-08 NOTE — Assessment & Plan Note (Signed)
Mild, may only need cortisone shot - for hand surgury referral

## 2016-04-08 NOTE — Progress Notes (Signed)
Subjective:    Patient ID: Mary MoundGloria H Branch, female    DOB: April 29, 1960, 55 y.o.   MRN: [id number]  HPI  Here for wellness and f/u;  Overall doing ok;  Pt denies Chest pain, worsening SOB, DOE, wheezing, orthopnea, PND, worsening LE edema, palpitations, dizziness or syncope.  Pt denies neurological change such as new headache, facial or extremity weakness.  Pt denies polydipsia, polyuria, or low sugar symptoms. Pt states overall good compliance with treatment and medications, good tolerability, and has been trying to follow appropriate diet.  Pt denies worsening depressive symptoms, suicidal ideation or panic. No fever, night sweats, wt loss, loss of appetite, or other constitutional symptoms.  Pt states good ability with ADL's, has low fall risk, home safety reviewed and adequate, no other significant changes in hearing or vision, and quite active with exercise with lot of walking at work, sometimes at home as well. .Declines flu shot. S/p cologuard recently with GYN, results pending.   Incidentally -  Here with 2-3 days acute onset fever, facial pain, pressure, headache, general weakness and malaise, and greenish d/c, with mild ST and cough  Also with palmar pain for several yrs, hx of left thumb tendonitis s/p cortisone, now with 4th finger left hand trigger like unabl to fully flex or extend  Gets right leg cramping at night, walks a lot during the day.  Past Medical History:  Diagnosis Date  . Allergic rhinitis, cause unspecified 01/02/2011  . ANEMIA-IRON DEFICIENCY 10/22/2009  . THUMB PAIN, RIGHT 10/22/2009  . TRANSAMINASES, SERUM, ELEVATED 10/22/2009   Past Surgical History:  Procedure Laterality Date  . OOPHORECTOMY  1998   right  . s/op fibroid surgury  1994   No TAH    reports that she quit smoking about 6 years ago. Her smoking use included Cigarettes. She has never used smokeless tobacco. She reports that she drinks about 2.0 oz of alcohol per week . She reports that she does not  use drugs. family history includes Cancer in her brother and father; Diabetes in her brother, mother, and sister; Hypertension in her mother and sister. Allergies  Allergen Reactions  . Latex     rash   No current outpatient prescriptions on file prior to visit.   No current facility-administered medications on file prior to visit.     Review of Systems Constitutional: Negative for increased diaphoresis, or other activity, appetite or siginficant weight change other than noted HENT: Negative for worsening hearing loss, ear pain, facial swelling, mouth sores and neck stiffness.   Eyes: Negative for other worsening pain, redness or visual disturbance.  Respiratory: Negative for choking or stridor Cardiovascular: Negative for other chest pain and palpitations.  Gastrointestinal: Negative for worsening diarrhea, blood in stool, or abdominal distention Genitourinary: Negative for hematuria, flank pain or change in urine volume.  Musculoskeletal: Negative for myalgias or other joint complaints.  Skin: Negative for other color change and wound or drainage.  Neurological: Negative for syncope and numbness. other than noted Hematological: Negative for adenopathy. or other swelling Psychiatric/Behavioral: Negative for hallucinations, SI, self-injury, decreased concentration or other worsening agitation.  All other system neg per pt    Objective:   Physical Exam BP 106/64   Pulse 96   Temp 98.6 F (37 C) (Oral)   Resp 14   Ht 5\' 8"  (1.727 m)   Wt 143 lb 12.8 oz (65.2 kg)   SpO2 97%   BMI 21.86 kg/m  VS noted, mild ill appearing Constitutional: Pt is  oriented to person, place, and time. Appears well-developed and well-nourished, in no significant distress Head: Normocephalic and atraumatic  Eyes: Conjunctivae and EOM are normal. Pupils are equal, round, and reactive to light Right Ear: External ear normal.  Left Ear: External ear normal Nose: Nose normal.  Bilat tm's with mild  erythema.  Max sinus areas mild tender.  Pharynx with mild erythema, no exudate Mouth/Throat: Oropharynx is clear and moist  Neck: Normal range of motion. Neck supple. No JVD present. No tracheal deviation present or significant neck LA or mass Cardiovascular: Normal rate, regular rhythm, normal heart sounds and intact distal pulses.   Pulmonary/Chest: Effort normal and breath sounds without rales or wheezing  Abdominal: Soft. Bowel sounds are normal. NT. No HSM  Musculoskeletal: Normal range of motion. Exhibits no edema; left 4th finger with trigger like abonormality with mild reduced ROM on flexion/extension Lymphadenopathy: Has no cervical adenopathy.  Neurological: Pt is alert and oriented to person, place, and time. Pt has normal reflexes. No cranial nerve deficit. Motor grossly intact Skin: Skin is warm and dry. No rash noted or new ulcers Psychiatric:  Has normal mood and affect. Behavior is normal.  No other new exam findings     Assessment & Plan:

## 2016-04-08 NOTE — Progress Notes (Signed)
Pre visit review using our clinic review tool, if applicable. No additional management support is needed unless otherwise documented below in the visit note. 

## 2016-04-19 DIAGNOSIS — R52 Pain, unspecified: Secondary | ICD-10-CM | POA: Diagnosis not present

## 2016-04-19 DIAGNOSIS — M65342 Trigger finger, left ring finger: Secondary | ICD-10-CM | POA: Diagnosis not present

## 2016-04-19 DIAGNOSIS — G5603 Carpal tunnel syndrome, bilateral upper limbs: Secondary | ICD-10-CM | POA: Diagnosis not present

## 2016-04-27 DIAGNOSIS — G5603 Carpal tunnel syndrome, bilateral upper limbs: Secondary | ICD-10-CM | POA: Diagnosis not present

## 2016-05-12 DIAGNOSIS — M65342 Trigger finger, left ring finger: Secondary | ICD-10-CM | POA: Diagnosis not present

## 2016-05-12 DIAGNOSIS — G5603 Carpal tunnel syndrome, bilateral upper limbs: Secondary | ICD-10-CM | POA: Diagnosis not present

## 2016-08-02 ENCOUNTER — Other Ambulatory Visit: Payer: Self-pay | Admitting: Internal Medicine

## 2016-10-21 ENCOUNTER — Telehealth: Payer: Self-pay | Admitting: Internal Medicine

## 2016-10-21 DIAGNOSIS — J019 Acute sinusitis, unspecified: Secondary | ICD-10-CM | POA: Diagnosis not present

## 2016-10-21 NOTE — Telephone Encounter (Signed)
Error

## 2018-02-23 DIAGNOSIS — Z130 Encounter for screening for diseases of the blood and blood-forming organs and certain disorders involving the immune mechanism: Secondary | ICD-10-CM | POA: Diagnosis not present

## 2018-02-23 DIAGNOSIS — Z01419 Encounter for gynecological examination (general) (routine) without abnormal findings: Secondary | ICD-10-CM | POA: Diagnosis not present

## 2018-02-23 DIAGNOSIS — Z1231 Encounter for screening mammogram for malignant neoplasm of breast: Secondary | ICD-10-CM | POA: Diagnosis not present

## 2018-02-23 DIAGNOSIS — Z6821 Body mass index (BMI) 21.0-21.9, adult: Secondary | ICD-10-CM | POA: Diagnosis not present

## 2018-02-23 DIAGNOSIS — Z1389 Encounter for screening for other disorder: Secondary | ICD-10-CM | POA: Diagnosis not present

## 2018-03-04 DIAGNOSIS — Z1211 Encounter for screening for malignant neoplasm of colon: Secondary | ICD-10-CM | POA: Diagnosis not present

## 2018-03-23 ENCOUNTER — Encounter: Payer: Self-pay | Admitting: Internal Medicine

## 2018-03-23 ENCOUNTER — Ambulatory Visit (INDEPENDENT_AMBULATORY_CARE_PROVIDER_SITE_OTHER): Payer: BLUE CROSS/BLUE SHIELD | Admitting: Internal Medicine

## 2018-03-23 ENCOUNTER — Other Ambulatory Visit (INDEPENDENT_AMBULATORY_CARE_PROVIDER_SITE_OTHER): Payer: BLUE CROSS/BLUE SHIELD

## 2018-03-23 VITALS — BP 112/78 | HR 93 | Temp 98.2°F | Ht 68.0 in | Wt 137.0 lb

## 2018-03-23 DIAGNOSIS — E785 Hyperlipidemia, unspecified: Secondary | ICD-10-CM | POA: Insufficient documentation

## 2018-03-23 DIAGNOSIS — R252 Cramp and spasm: Secondary | ICD-10-CM | POA: Diagnosis not present

## 2018-03-23 DIAGNOSIS — Z114 Encounter for screening for human immunodeficiency virus [HIV]: Secondary | ICD-10-CM

## 2018-03-23 DIAGNOSIS — Z0000 Encounter for general adult medical examination without abnormal findings: Secondary | ICD-10-CM

## 2018-03-23 DIAGNOSIS — K130 Diseases of lips: Secondary | ICD-10-CM | POA: Diagnosis not present

## 2018-03-23 DIAGNOSIS — N289 Disorder of kidney and ureter, unspecified: Secondary | ICD-10-CM

## 2018-03-23 HISTORY — DX: Hyperlipidemia, unspecified: E78.5

## 2018-03-23 LAB — LIPID PANEL
Cholesterol: 162 mg/dL (ref 0–200)
HDL: 38.9 mg/dL — ABNORMAL LOW (ref >39.00)
LDL Cholesterol: 93 mg/dL (ref 0–99)
NonHDL: 123.27
Total CHOL/HDL Ratio: 4
Triglycerides: 151 mg/dL — ABNORMAL HIGH (ref 0.0–149.0)
VLDL: 30.2 mg/dL (ref 0.0–40.0)

## 2018-03-23 LAB — CBC WITH DIFFERENTIAL/PLATELET
Basophils Absolute: 0.1 10*3/uL (ref 0.0–0.1)
Basophils Relative: 1.3 % (ref 0.0–3.0)
Eosinophils Absolute: 0.2 10*3/uL (ref 0.0–0.7)
Eosinophils Relative: 2.9 % (ref 0.0–5.0)
HCT: 41.1 % (ref 36.0–46.0)
Hemoglobin: 13.4 g/dL (ref 12.0–15.0)
Lymphocytes Relative: 38.5 % (ref 12.0–46.0)
Lymphs Abs: 3.1 10*3/uL (ref 0.7–4.0)
MCHC: 32.6 g/dL (ref 30.0–36.0)
MCV: 85.4 fl (ref 78.0–100.0)
MONOS PCT: 5 % (ref 3.0–12.0)
Monocytes Absolute: 0.4 10*3/uL (ref 0.1–1.0)
Neutro Abs: 4.2 10*3/uL (ref 1.4–7.7)
Neutrophils Relative %: 52.3 % (ref 43.0–77.0)
Platelets: 186 10*3/uL (ref 150.0–400.0)
RBC: 4.81 Mil/uL (ref 3.87–5.11)
RDW: 13.8 % (ref 11.5–15.5)
WBC: 8 10*3/uL (ref 4.0–10.5)

## 2018-03-23 LAB — URINALYSIS, ROUTINE W REFLEX MICROSCOPIC
Bilirubin Urine: NEGATIVE
HGB URINE DIPSTICK: NEGATIVE
Ketones, ur: NEGATIVE
Leukocytes, UA: NEGATIVE
Nitrite: NEGATIVE
Specific Gravity, Urine: 1.02 (ref 1.000–1.030)
Total Protein, Urine: NEGATIVE
Urine Glucose: NEGATIVE
Urobilinogen, UA: 0.2 (ref 0.0–1.0)
pH: 6 (ref 5.0–8.0)

## 2018-03-23 LAB — BASIC METABOLIC PANEL
BUN: 19 mg/dL (ref 6–23)
CO2: 25 mEq/L (ref 19–32)
CREATININE: 1.07 mg/dL (ref 0.40–1.20)
Calcium: 9 mg/dL (ref 8.4–10.5)
Chloride: 110 mEq/L (ref 96–112)
GFR: 67.75 mL/min (ref >60.00)
Glucose, Bld: 93 mg/dL (ref 70–99)
Potassium: 4.1 mEq/L (ref 3.5–5.1)
SODIUM: 140 meq/L (ref 135–145)

## 2018-03-23 LAB — HEPATIC FUNCTION PANEL
ALK PHOS: 65 U/L (ref 39–117)
ALT: 22 U/L (ref 0–35)
AST: 22 U/L (ref 0–37)
Albumin: 3.6 g/dL (ref 3.5–5.2)
Bilirubin, Direct: 0.1 mg/dL (ref 0.0–0.3)
Total Bilirubin: 0.3 mg/dL (ref 0.2–1.2)
Total Protein: 5.4 g/dL — ABNORMAL LOW (ref 6.0–8.3)

## 2018-03-23 LAB — TSH: TSH: 0.61 u[IU]/mL (ref 0.35–4.50)

## 2018-03-23 MED ORDER — CYCLOBENZAPRINE HCL 5 MG PO TABS: 5.0000 mg | ORAL_TABLET | Freq: Every evening | ORAL | 5 refills | Status: DC | PRN

## 2018-03-23 MED ORDER — CLOTRIMAZOLE-BETAMETHASONE 1-0.05 % EX CREA: TOPICAL_CREAM | CUTANEOUS | 1 refills | Status: DC

## 2018-03-23 NOTE — Assessment & Plan Note (Signed)
Very mild but possible ckd 3 -? Reason, for f/u labs, consider renal referral

## 2018-03-23 NOTE — Patient Instructions (Signed)
Please take all new medication as prescribed - the cream as needed  Please continue all other medications as before, and refills have been done if requested.  Please have the pharmacy call with any other refills you may need.  Please continue your efforts at being more active, low cholesterol diet, and weight control.  You are otherwise up to date with prevention measures today.  Please keep your appointments with your specialists as you may have planned  Please go to the LAB in the Basement (turn left off the elevator) for the tests to be done today  You will be contacted by phone if any changes need to be made immediately.  Otherwise, you will receive a letter about your results with an explanation, but please check with MyChart first.  Please remember to sign up for MyChart if you have not done so, as this will be important to you in the future with finding out test results, communicating by private email, and scheduling acute appointments online when needed.  Please return in 1 year for your yearly visit, or sooner if needed, with Lab testing done 3-5 days before

## 2018-03-23 NOTE — Assessment & Plan Note (Signed)
For lotrisone bid prn,  to f/u any worsening symptoms or concerns

## 2018-03-23 NOTE — Assessment & Plan Note (Signed)

## 2018-03-23 NOTE — Assessment & Plan Note (Signed)
Ok for tonic water prn
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
## 2018-03-24 LAB — HIV ANTIBODY (ROUTINE TESTING W REFLEX): HIV 1&2 Ab, 4th Generation: NONREACTIVE

## 2019-11-09 ENCOUNTER — Other Ambulatory Visit: Payer: Self-pay

## 2019-11-09 ENCOUNTER — Encounter: Payer: Self-pay | Admitting: Family

## 2019-11-09 ENCOUNTER — Ambulatory Visit (INDEPENDENT_AMBULATORY_CARE_PROVIDER_SITE_OTHER): Payer: BC Managed Care – PPO | Admitting: Family

## 2019-11-09 ENCOUNTER — Other Ambulatory Visit: Payer: Self-pay | Admitting: Family

## 2019-11-09 VITALS — BP 102/70 | HR 74 | Temp 98.8°F | Resp 16 | Ht 68.0 in | Wt 131.5 lb

## 2019-11-09 DIAGNOSIS — R310 Gross hematuria: Secondary | ICD-10-CM

## 2019-11-09 DIAGNOSIS — L989 Disorder of the skin and subcutaneous tissue, unspecified: Secondary | ICD-10-CM

## 2019-11-09 LAB — POC URINALSYSI DIPSTICK (AUTOMATED)
Bilirubin, UA: NEGATIVE
Blood, UA: NEGATIVE
Glucose, UA: NEGATIVE
Ketones, UA: NEGATIVE
Leukocytes, UA: NEGATIVE
Nitrite, UA: NEGATIVE
Protein, UA: NEGATIVE
Spec Grav, UA: 1.02 (ref 1.010–1.025)
Urobilinogen, UA: 0.2 E.U./dL
pH, UA: 6 (ref 5.0–8.0)

## 2019-11-09 MED ORDER — SULFAMETHOXAZOLE-TRIMETHOPRIM 800-160 MG PO TABS: 1.0000 | ORAL_TABLET | Freq: Two times a day (BID) | ORAL | 0 refills | Status: DC

## 2019-11-09 NOTE — Addendum Note (Signed)
Addended by: Merrilyn Puma on: 11/09/2019 04:23 PM   Modules accepted: Orders

## 2019-11-09 NOTE — Addendum Note (Signed)
Addended by: Verlan Friends on: 11/09/2019 04:22 PM   Modules accepted: Orders

## 2019-11-09 NOTE — Progress Notes (Signed)
Mary Branch is a 59 y.o. female with the following history as recorded in [employer]:  Patient Active Problem List   Diagnosis Date Noted  . Angular cheilitis 03/23/2018  . Renal insufficiency 03/23/2018  . HLD (hyperlipidemia) 03/23/2018  . Trigger finger, acquired 04/08/2016  . Leg cramping 04/08/2016  . Bilateral hand pain 04/02/2014  . Acute sinus infection 12/26/2013  . Bilateral carpal tunnel syndrome 05/06/2012  . Back pain 01/02/2011  . Allergic rhinitis 01/02/2011  . Preventative health care 11/29/2010  . ANEMIA-IRON DEFICIENCY 10/22/2009    Current Outpatient Medications  Medication Sig Dispense Refill  . sulfamethoxazole-trimethoprim (BACTRIM DS) 800-160 MG tablet Take 1 tablet by mouth 2 (two) times daily. 10 tablet 0   No current facility-administered medications for this visit.    Allergies: Latex  Past Medical History:  Diagnosis Date  . Allergic rhinitis, cause unspecified 01/02/2011  . ANEMIA-IRON DEFICIENCY 10/22/2009  . HLD (hyperlipidemia) 03/23/2018  . THUMB PAIN, RIGHT 10/22/2009  . TRANSAMINASES, SERUM, ELEVATED 10/22/2009    Past Surgical History:  Procedure Laterality Date  . OOPHORECTOMY  1998   right  . s/op fibroid surgury  1994   No TAH    Family History  Problem Relation Age of Onset  . Diabetes Mother   . Hypertension Mother   . Cancer Father        esophageal cancer  . Diabetes Sister   . Hypertension Sister   . Cancer Brother        lung cancer  . Diabetes Brother     Social History   Tobacco Use  . Smoking status: Former Smoker    Types: Cigarettes    Quit date: 03/01/2010    Years since quitting: 9.6  . Smokeless tobacco: Never Used  Substance Use Topics  . Alcohol use: Yes    Alcohol/week: 4.0 standard drinks    Types: 4 drink(s) per week    Subjective:   1 episode of gross blood in urine yesterday; denies any flank pain or burning or frequency on urination; not prone to UTIs or kidney stone; does not have regular  periods;   Also concerned about irregular mole on left flank- feels that area of concern is getting bigger/ needs evaluation;    Objective:  Vitals:   11/09/19 1517  BP: 102/70  Pulse: 74  Resp: 16  Temp: 98.8 F (37.1 C)  TempSrc: Oral  Weight: 131 lb 8 oz (59.6 kg)  Height: 5\' 8"  (1.727 m)    General: Well developed, well nourished, in no acute distress  Skin : Warm and dry.  Head: Normocephalic and atraumatic  Lungs: Respirations unlabored;  Neurologic: Alert and oriented; speech intact; face symmetrical; moves all extremities well; CNII-XII intact without focal deficit  Assessment:  1. Gross hematuria   2. Changing skin lesion     Plan:  1. ? UTI; check urine culture and start Bactrim DS bid x 5 days; if urine culture is clear, will need to consider imaging to evaluate for kidney stone; 2. Refer to dermatology;  She will schedule with her PCP for yearly CPE;  This visit occurred during the SARS-CoV-2 public health emergency.  Safety protocols were in place, including screening questions prior to the visit, additional usage of staff PPE, and extensive cleaning of exam room while observing appropriate contact time as indicated for disinfecting solutions.     No follow-ups on file.  Orders Placed This Encounter  Procedures  . Ambulatory referral to Dermatology  Referral Priority:   Routine    Referral Type:   Consultation    Referral Reason:   Specialty Services Required    Requested Specialty:   Dermatology    Number of Visits Requested:   1  . POCT Urinalysis Dipstick (Automated)    Requested Prescriptions   Signed Prescriptions Disp Refills  . sulfamethoxazole-trimethoprim (BACTRIM DS) 800-160 MG tablet 10 tablet 0    Sig: Take 1 tablet by mouth 2 (two) times daily.

## 2019-11-10 LAB — URINE CULTURE

## 2019-11-19 ENCOUNTER — Other Ambulatory Visit: Payer: Self-pay | Admitting: Family

## 2019-11-19 DIAGNOSIS — R319 Hematuria, unspecified: Secondary | ICD-10-CM

## 2019-11-22 ENCOUNTER — Ambulatory Visit (INDEPENDENT_AMBULATORY_CARE_PROVIDER_SITE_OTHER)
Admission: RE | Admit: 2019-11-22 | Discharge: 2019-11-22 | Disposition: A | Discharge status: Home / Self Care | Payer: BC Managed Care – PPO | Source: Ambulatory Visit | Attending: Family | Admitting: Family

## 2019-11-22 ENCOUNTER — Other Ambulatory Visit: Payer: Self-pay

## 2019-11-22 DIAGNOSIS — I700 Atherosclerosis of aorta: Secondary | ICD-10-CM | POA: Diagnosis not present

## 2019-11-22 DIAGNOSIS — R319 Hematuria, unspecified: Secondary | ICD-10-CM | POA: Diagnosis not present

## 2019-11-22 DIAGNOSIS — M47817 Spondylosis without myelopathy or radiculopathy, lumbosacral region: Secondary | ICD-10-CM | POA: Diagnosis not present

## 2019-11-22 DIAGNOSIS — R109 Unspecified abdominal pain: Secondary | ICD-10-CM | POA: Diagnosis not present

## 2019-12-12 ENCOUNTER — Ambulatory Visit (INDEPENDENT_AMBULATORY_CARE_PROVIDER_SITE_OTHER): Payer: BC Managed Care – PPO | Admitting: Internal Medicine

## 2019-12-12 ENCOUNTER — Other Ambulatory Visit: Payer: Self-pay

## 2019-12-12 ENCOUNTER — Encounter: Payer: Self-pay | Admitting: Internal Medicine

## 2019-12-12 VITALS — BP 110/82 | HR 86 | Temp 98.2°F | Ht 68.0 in | Wt 130.0 lb

## 2019-12-12 DIAGNOSIS — Z0000 Encounter for general adult medical examination without abnormal findings: Secondary | ICD-10-CM | POA: Diagnosis not present

## 2019-12-12 DIAGNOSIS — R310 Gross hematuria: Secondary | ICD-10-CM | POA: Diagnosis not present

## 2019-12-12 NOTE — Patient Instructions (Signed)
Please continue all other medications as before, and refills have been done if requested.  Please have the pharmacy call with any other refills you may need.  Please continue your efforts at being more active, low cholesterol diet, and weight control.  You are otherwise up to date with prevention measures today.  Please keep your appointments with your specialists as you may have planned  You will be contacted regarding the referral for: Urology  Please go to the LAB at the blood drawing area for the tests to be done  You will be contacted by phone if any changes need to be made immediately.  Otherwise, you will receive a letter about your results with an explanation, but please check with MyChart first.  Please remember to sign up for MyChart if you have not done so, as this will be important to you in the future with finding out test results, communicating by private email, and scheduling acute appointments online when needed.  Please make an Appointment to return for your 1 year visit, or sooner if needed

## 2019-12-12 NOTE — Assessment & Plan Note (Signed)

## 2019-12-12 NOTE — Progress Notes (Signed)
Subjective:    Patient ID: Mary Branch, female    DOB: Dec 01, 1960, 59 y.o.   MRN: [id number]  HPI  Here for wellness and f/u;  Overall doing ok;  Pt denies Chest pain, worsening SOB, DOE, wheezing, orthopnea, PND, worsening LE edema, palpitations, dizziness or syncope.  Pt denies neurological change such as new headache, facial or extremity weakness.  Pt denies polydipsia, polyuria, or low sugar symptoms. Pt states overall good compliance with treatment and medications, good tolerability, and has been trying to follow appropriate diet.  Pt denies worsening depressive symptoms, suicidal ideation or panic. No fever, night sweats, wt loss, loss of appetite, or other constitutional symptoms.  Pt states good ability with ADL's, has low fall risk, home safety reviewed and adequate, no other significant changes in hearing or vision, and only occasionally active with exercise.  Denies urinary symptoms such as dysuria, frequency, urgency, flank pain, hematuria or n/v, fever, chills, except for episode of gross hematuria x 1 recent, ct renal stone negative.   Past Medical History:  Diagnosis Date  . Allergic rhinitis, cause unspecified 01/02/2011  . ANEMIA-IRON DEFICIENCY 10/22/2009  . HLD (hyperlipidemia) 03/23/2018  . THUMB PAIN, RIGHT 10/22/2009  . TRANSAMINASES, SERUM, ELEVATED 10/22/2009   Past Surgical History:  Procedure Laterality Date  . OOPHORECTOMY  1998   right  . s/op fibroid surgury  1994   No TAH    reports that she quit smoking about 9 years ago. Her smoking use included cigarettes. She has never used smokeless tobacco. She reports current alcohol use of about 4.0 standard drinks of alcohol per week. She reports that she does not use drugs. family history includes Cancer in her brother and father; Diabetes in her brother, mother, and sister; Hypertension in her mother and sister. Allergies  Allergen Reactions  . Latex     rash   No current outpatient medications on file prior to  visit.   No current facility-administered medications on file prior to visit.   Review of Systems All otherwise neg per pt    Objective:   Physical Exam BP 110/82 (BP Location: Left Arm, Patient Position: Sitting, Cuff Size: Large)   Pulse 86   Temp 98.2 F (36.8 C) (Oral)   Ht 5\' 8"  (1.727 m)   Wt 130 lb (59 kg)   SpO2 94%   BMI 19.77 kg/m  VS noted,  Constitutional: Pt appears in NAD HENT: Head: NCAT.  Right Ear: External ear normal.  Left Ear: External ear normal.  Eyes: . Pupils are equal, round, and reactive to light. Conjunctivae and EOM are normal Nose: without d/c or deformity Neck: Neck supple. Gross normal ROM Cardiovascular: Normal rate and regular rhythm.   Pulmonary/Chest: Effort normal and breath sounds without rales or wheezing.  Abd:  Soft, NT, ND, + BS, no organomegaly Neurological: Pt is alert. At baseline orientation, motor grossly intact Skin: Skin is warm. No rashes, other new lesions, no LE edema Psychiatric: Pt behavior is normal without agitation  All otherwise neg per pt.lastfp Lab Results  Component Value Date   WBC 8.0 03/23/2018   HGB 13.4 03/23/2018   HCT 41.1 03/23/2018   PLT 186.0 03/23/2018   GLUCOSE 93 03/23/2018   CHOL 162 03/23/2018   TRIG 151.0 (H) 03/23/2018   HDL 38.90 (L) 03/23/2018   LDLCALC 93 03/23/2018   ALT 22 03/23/2018   AST 22 03/23/2018   NA 140 03/23/2018   K 4.1 03/23/2018   CL 110 03/23/2018  CREATININE 1.07 03/23/2018   BUN 19 03/23/2018   CO2 25 03/23/2018   TSH 0.61 03/23/2018      Assessment & Plan:

## 2019-12-12 NOTE — Assessment & Plan Note (Signed)
Also for urology referral for cysto

## 2019-12-13 ENCOUNTER — Encounter: Payer: Self-pay | Admitting: Internal Medicine

## 2019-12-13 LAB — CBC WITH DIFFERENTIAL/PLATELET
Absolute Monocytes: 547 cells/uL (ref 200–950)
Basophils Absolute: 31 cells/uL (ref 0–200)
Basophils Relative: 0.4 %
Eosinophils Absolute: 223 cells/uL (ref 15–500)
Eosinophils Relative: 2.9 %
HCT: 44.6 % (ref 35.0–45.0)
Hemoglobin: 14.5 g/dL (ref 11.7–15.5)
Lymphs Abs: 2757 cells/uL (ref 850–3900)
MCH: 27.7 pg (ref 27.0–33.0)
MCHC: 32.5 g/dL (ref 32.0–36.0)
MCV: 85.3 fL (ref 80.0–100.0)
MPV: 11.7 fL (ref 7.5–12.5)
Monocytes Relative: 7.1 %
Neutro Abs: 4143 cells/uL (ref 1500–7800)
Neutrophils Relative %: 53.8 %
Platelets: 199 10*3/uL (ref 140–400)
RBC: 5.23 10*6/uL — ABNORMAL HIGH (ref 3.80–5.10)
RDW: 12.8 % (ref 11.0–15.0)
Total Lymphocyte: 35.8 %
WBC: 7.7 10*3/uL (ref 3.8–10.8)

## 2019-12-13 LAB — COMPLETE METABOLIC PANEL WITH GFR
AG Ratio: 2.1 (calc) (ref 1.0–2.5)
ALT: 21 U/L (ref 6–29)
AST: 19 U/L (ref 10–35)
Albumin: 3.8 g/dL (ref 3.6–5.1)
Alkaline phosphatase (APISO): 70 U/L (ref 37–153)
BUN/Creatinine Ratio: 17 (calc) (ref 6–22)
BUN: 18 mg/dL (ref 7–25)
CO2: 27 mmol/L (ref 20–32)
Calcium: 9.9 mg/dL (ref 8.6–10.4)
Chloride: 107 mmol/L (ref 98–110)
Creat: 1.09 mg/dL — ABNORMAL HIGH (ref 0.50–1.05)
GFR, Est African American: 64 mL/min/{1.73_m2} (ref >=60)
GFR, Est Non African American: 56 mL/min/{1.73_m2} — ABNORMAL LOW (ref >=60)
Globulin: 1.8 g/dL (calc) — ABNORMAL LOW (ref 1.9–3.7)
Glucose, Bld: 100 mg/dL — ABNORMAL HIGH (ref 65–99)
Potassium: 5.4 mmol/L — ABNORMAL HIGH (ref 3.5–5.3)
Sodium: 141 mmol/L (ref 135–146)
Total Bilirubin: 0.2 mg/dL (ref 0.2–1.2)
Total Protein: 5.6 g/dL — ABNORMAL LOW (ref 6.1–8.1)

## 2019-12-13 LAB — TSH: TSH: 0.91 mIU/L (ref 0.40–4.50)

## 2019-12-13 LAB — LIPID PANEL
Cholesterol: 206 mg/dL — ABNORMAL HIGH (ref <200)
HDL: 39 mg/dL — ABNORMAL LOW (ref >=50)
LDL Cholesterol (Calc): 131 mg/dL (calc) — ABNORMAL HIGH
Non-HDL Cholesterol (Calc): 167 mg/dL (calc) — ABNORMAL HIGH (ref <130)
Total CHOL/HDL Ratio: 5.3 (calc) — ABNORMAL HIGH (ref <5.0)
Triglycerides: 225 mg/dL — ABNORMAL HIGH (ref <150)

## 2019-12-13 LAB — URINALYSIS, ROUTINE W REFLEX MICROSCOPIC
Bilirubin Urine: NEGATIVE
Glucose, UA: NEGATIVE
Hgb urine dipstick: NEGATIVE
Ketones, ur: NEGATIVE
Nitrite: NEGATIVE
Protein, ur: NEGATIVE
RBC / HPF: NONE SEEN /HPF (ref 0–2)
Specific Gravity, Urine: 1.02 (ref 1.001–1.03)
pH: 6.5 (ref 5.0–8.0)

## 2020-01-14 DIAGNOSIS — L821 Other seborrheic keratosis: Secondary | ICD-10-CM | POA: Diagnosis not present

## 2020-01-14 DIAGNOSIS — L729 Follicular cyst of the skin and subcutaneous tissue, unspecified: Secondary | ICD-10-CM | POA: Diagnosis not present

## 2020-02-15 DIAGNOSIS — D485 Neoplasm of uncertain behavior of skin: Secondary | ICD-10-CM | POA: Diagnosis not present

## 2020-04-24 DIAGNOSIS — D481 Neoplasm of uncertain behavior of connective and other soft tissue: Secondary | ICD-10-CM | POA: Diagnosis not present

## 2020-04-24 DIAGNOSIS — D219 Benign neoplasm of connective and other soft tissue, unspecified: Secondary | ICD-10-CM | POA: Diagnosis not present

## 2020-08-15 DIAGNOSIS — R350 Frequency of micturition: Secondary | ICD-10-CM | POA: Diagnosis not present

## 2020-08-15 DIAGNOSIS — R310 Gross hematuria: Secondary | ICD-10-CM | POA: Diagnosis not present

## 2022-03-30 DIAGNOSIS — M545 Low back pain, unspecified: Secondary | ICD-10-CM | POA: Diagnosis not present

## 2022-03-30 DIAGNOSIS — R102 Pelvic and perineal pain: Secondary | ICD-10-CM | POA: Diagnosis not present

## 2022-04-25 DIAGNOSIS — J019 Acute sinusitis, unspecified: Secondary | ICD-10-CM | POA: Diagnosis not present

## 2022-04-25 DIAGNOSIS — J441 Chronic obstructive pulmonary disease with (acute) exacerbation: Secondary | ICD-10-CM | POA: Diagnosis not present

## 2022-05-05 ENCOUNTER — Ambulatory Visit (INDEPENDENT_AMBULATORY_CARE_PROVIDER_SITE_OTHER): Payer: BC Managed Care – PPO | Admitting: Internal Medicine

## 2022-05-05 ENCOUNTER — Ambulatory Visit (INDEPENDENT_AMBULATORY_CARE_PROVIDER_SITE_OTHER): Payer: BC Managed Care – PPO

## 2022-05-05 VITALS — BP 98/64 | Temp 98.4°F | Ht 68.0 in | Wt 141.1 lb

## 2022-05-05 DIAGNOSIS — J309 Allergic rhinitis, unspecified: Secondary | ICD-10-CM

## 2022-05-05 DIAGNOSIS — R062 Wheezing: Secondary | ICD-10-CM | POA: Diagnosis not present

## 2022-05-05 DIAGNOSIS — R051 Acute cough: Secondary | ICD-10-CM | POA: Diagnosis not present

## 2022-05-05 DIAGNOSIS — J449 Chronic obstructive pulmonary disease, unspecified: Secondary | ICD-10-CM | POA: Diagnosis not present

## 2022-05-05 DIAGNOSIS — R059 Cough, unspecified: Secondary | ICD-10-CM | POA: Diagnosis not present

## 2022-05-05 MED ORDER — ALBUTEROL SULFATE HFA 108 (90 BASE) MCG/ACT IN AERS: 2.0000 | INHALATION_SPRAY | Freq: Four times a day (QID) | RESPIRATORY_TRACT | 0 refills | Status: DC | PRN

## 2022-05-05 MED ORDER — HYDROCODONE BIT-HOMATROP MBR 5-1.5 MG/5ML PO SOLN: 5.0000 mL | Freq: Four times a day (QID) | ORAL | 0 refills | Status: AC | PRN

## 2022-05-05 MED ORDER — HYDROCODONE BIT-HOMATROP MBR 5-1.5 MG/5ML PO SOLN: 5.0000 mL | Freq: Four times a day (QID) | ORAL | 0 refills | Status: DC | PRN

## 2022-05-05 MED ORDER — ALBUTEROL SULFATE HFA 108 (90 BASE) MCG/ACT IN AERS: 2.0000 | INHALATION_SPRAY | Freq: Four times a day (QID) | RESPIRATORY_TRACT | 0 refills | Status: AC | PRN

## 2022-05-05 MED ORDER — LEVOFLOXACIN 500 MG PO TABS: 500.0000 mg | ORAL_TABLET | Freq: Every day | ORAL | 0 refills | Status: AC

## 2022-05-05 MED ORDER — LEVOFLOXACIN 500 MG PO TABS: 500.0000 mg | ORAL_TABLET | Freq: Every day | ORAL | 0 refills | Status: DC

## 2022-05-05 MED ORDER — PREDNISONE 10 MG PO TABS: ORAL_TABLET | ORAL | 0 refills | Status: AC

## 2022-05-05 MED ORDER — PREDNISONE 10 MG PO TABS: ORAL_TABLET | ORAL | 0 refills | Status: DC

## 2022-05-05 NOTE — Patient Instructions (Signed)
Please take all new medication as prescribed - the antibiotic, cough medicine, prednisone, and Inhaler as needed  Please continue all other medications as before, and refills have been done if requested.  Please have the pharmacy call with any other refills you may need.  Please keep your appointments with your specialists as you may have planned  Please go to the XRAY Department in the first floor for the x-ray testing  You will be contacted by phone if any changes need to be made immediately.  Otherwise, you will receive a letter about your results with an explanation, but please check with MyChart first.  Please remember to sign up for MyChart if you have not done so, as this will be important to you in the future with finding out test results, communicating by private email, and scheduling acute appointments online when needed.

## 2022-05-05 NOTE — Progress Notes (Unsigned)
Patient ID: Mary Branch, female   DOB: [birth year], 62 y.o.   MRN: [id number]        Chief Complaint: follow up cough, wheezing,        HPI:  Mary Branch is a 62 y.o. female Here with acute onset mild to mod 2 wks ST, HA, general weakness and malaise, with prod cough greenish sputum, but Pt denies chest pain, increased sob or doe, wheezing, orthopnea, PND, increased LE swelling, palpitations, dizziness or syncope, except for mild wheezing and sob in the past several days.   Pt denies polydipsia, polyuria, or new focal neuro s/s.          Wt Readings from Last 3 Encounters:  05/05/22 141 lb 2 oz (64 kg)  12/12/19 130 lb (59 kg)  11/09/19 131 lb 8 oz (59.6 kg)   BP Readings from Last 3 Encounters:  05/05/22 98/64  12/12/19 110/82  11/09/19 102/70         Past Medical History:  Diagnosis Date   Allergic rhinitis, cause unspecified 01/02/2011   ANEMIA-IRON DEFICIENCY 10/22/2009   HLD (hyperlipidemia) 03/23/2018   THUMB PAIN, RIGHT 10/22/2009   TRANSAMINASES, SERUM, ELEVATED 10/22/2009   Past Surgical History:  Procedure Laterality Date   OOPHORECTOMY  1998   right   s/op fibroid surgury  1994   No TAH    reports that she quit smoking about 12 years ago. Her smoking use included cigarettes. She has never used smokeless tobacco. She reports current alcohol use of about 4.0 standard drinks of alcohol per week. She reports that she does not use drugs. family history includes Cancer in her brother and father; Diabetes in her brother, mother, and sister; Hypertension in her mother and sister. Allergies  Allergen Reactions   Latex     rash   Current Outpatient Medications on File Prior to Visit  Medication Sig Dispense Refill   azelastine (ASTELIN) 0.1 % nasal spray Place into both nostrils.     No current facility-administered medications on file prior to visit.        ROS:  All others reviewed and negative.  Objective        PE:  BP 98/64   Temp 98.4 F (36.9 C)  (Temporal)   Ht 5\' 8"  (1.727 m)   Wt 141 lb 2 oz (64 kg)   SpO2 96%   BMI 21.46 kg/m                 Constitutional: Pt appears in NAD, mild ill, warm feeling               HENT: Head: NCAT.                Right Ear: External ear normal.                 Left Ear: External ear normal.                Eyes: . Pupils are equal, round, and reactive to light. Conjunctivae and EOM are normal               Nose: without d/c or deformity               Neck: Neck supple. Gross normal ROM               Cardiovascular: Normal rate and regular rhythm.  Pulmonary/Chest: Effort normal and breath sounds without rales but with few bilat wheezing.                               Neurological: Pt is alert. At baseline orientation, motor grossly intact               Skin: Skin is warm. No rashes, no other new lesions, LE edema - none               Psychiatric: Pt behavior is normal without agitation   Micro: none  Cardiac tracings I have personally interpreted today:  none  Pertinent Radiological findings (summarize): none   Lab Results  Component Value Date   WBC 7.7 12/12/2019   HGB 14.5 12/12/2019   HCT 44.6 12/12/2019   PLT 199 12/12/2019   GLUCOSE 100 (H) 12/12/2019   CHOL 206 (H) 12/12/2019   TRIG 225 (H) 12/12/2019   HDL 39 (L) 12/12/2019   LDLCALC 131 (H) 12/12/2019   ALT 21 12/12/2019   AST 19 12/12/2019   NA 141 12/12/2019   K 5.4 (H) 12/12/2019   CL 107 12/12/2019   CREATININE 1.09 (H) 12/12/2019   BUN 18 12/12/2019   CO2 27 12/12/2019   TSH 0.91 12/12/2019   Assessment/Plan:  Mary Branch is a 62 y.o. Black or African American [2] female with  has a past medical history of Allergic rhinitis, cause unspecified (01/02/2011), ANEMIA-IRON DEFICIENCY (10/22/2009), HLD (hyperlipidemia) (03/23/2018), THUMB PAIN, RIGHT (10/22/2009), and TRANSAMINASES, SERUM, ELEVATED (10/22/2009).  Acute cough Mild to mod, for antibx course,levaquin 500 qd, cough med prn, CXR r/o  pneumonia, to f/u any worsening symptoms or concerns  Wheezing Mild to mod, for prednisone taper, inhaler prn,,  to f/u any worsening symptoms or concerns  Allergic rhinitis Stable overall but may improve as well with prednisone taper,  to f/u any worsening symptoms or concerns  Followup: Return if symptoms worsen or fail to improve.  Cathlean Cower, MD 05/06/2022 9:03 PM [facility]

## 2022-05-06 ENCOUNTER — Encounter: Payer: Self-pay | Admitting: Internal Medicine

## 2022-05-06 NOTE — Assessment & Plan Note (Signed)
Mild to mod, for antibx course,levaquin 500 qd, cough med prn, CXR r/o pneumonia, to f/u any worsening symptoms or concerns

## 2022-05-06 NOTE — Assessment & Plan Note (Signed)
Stable overall but may improve as well with prednisone taper,  to f/u any worsening symptoms or concerns

## 2022-05-06 NOTE — Assessment & Plan Note (Signed)
Mild to mod, for prednisone taper, inhaler prn,,  to f/u any worsening symptoms or concerns

## 2022-06-15 IMAGING — CT CT RENAL STONE PROTOCOL
2 of 4 series · 16 of 46 positions shown, 18 images · non-contrast
Comparison: None.

CLINICAL DATA: One episode of hematuria with RIGHT flank pain at
the end [DATE]

EXAM:
CT ABDOMEN AND PELVIS WITHOUT CONTRAST
TECHNIQUE: Multidetector CT imaging of the abdomen and pelvis was performed
following the standard protocol without IV contrast.

[Series 2: stone study 5.0 br38 1 · axial · 0.60mm/px · z∈[-476,-112]mm · 13 of 81 slices shown, 15 images]
[im 4/81  soft-tissue]
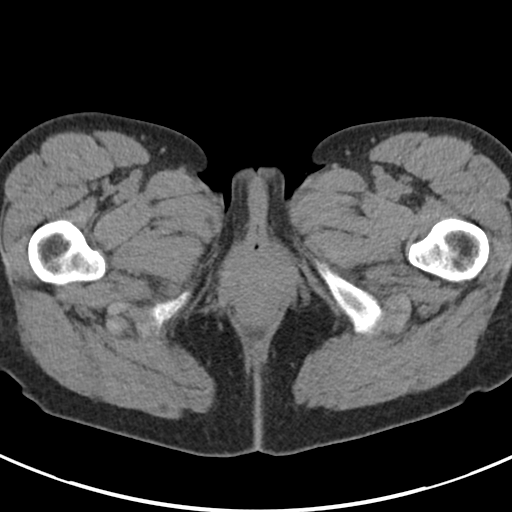
[im 4/81  bone]
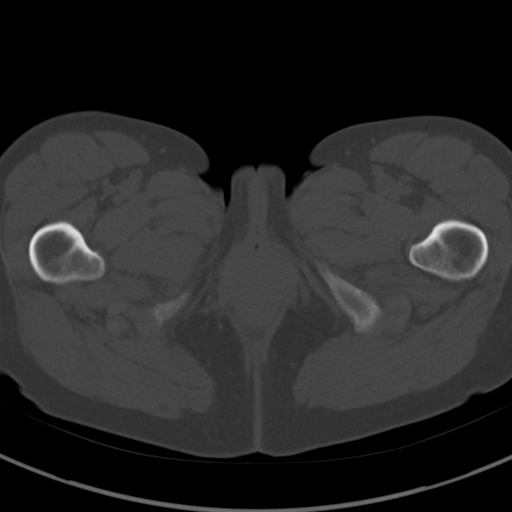
[im 10/81  soft-tissue]
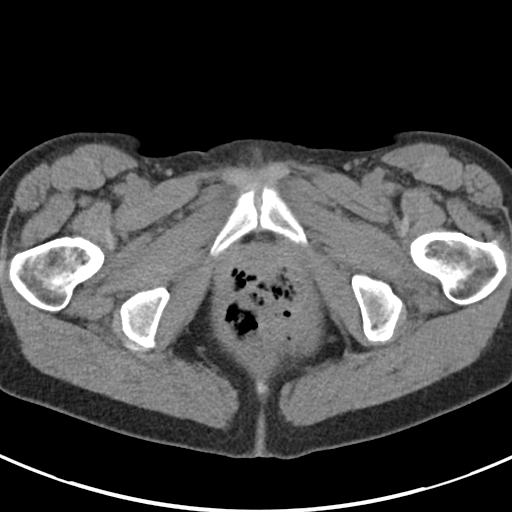
[im 16/81  soft-tissue]
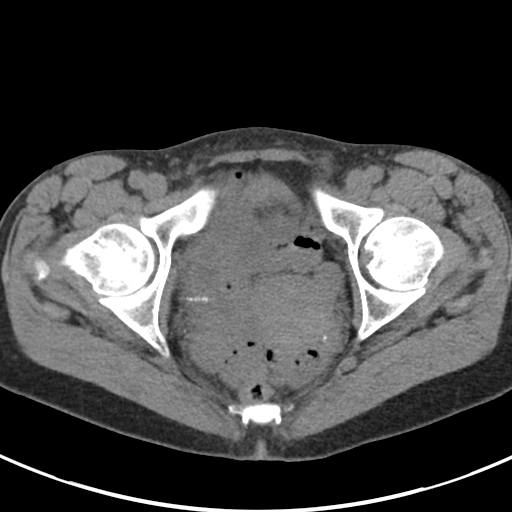
[im 22/81  soft-tissue]
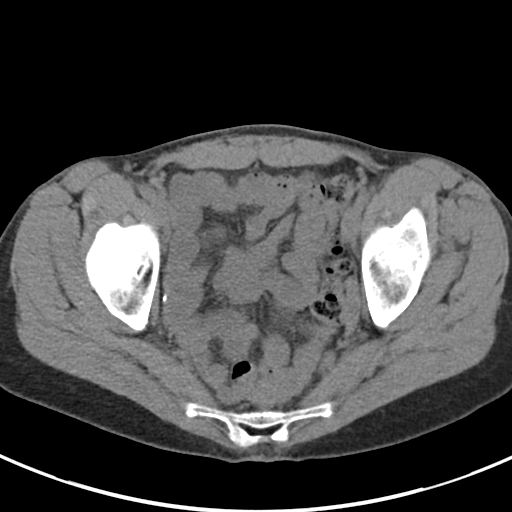
[im 28/81  soft-tissue]
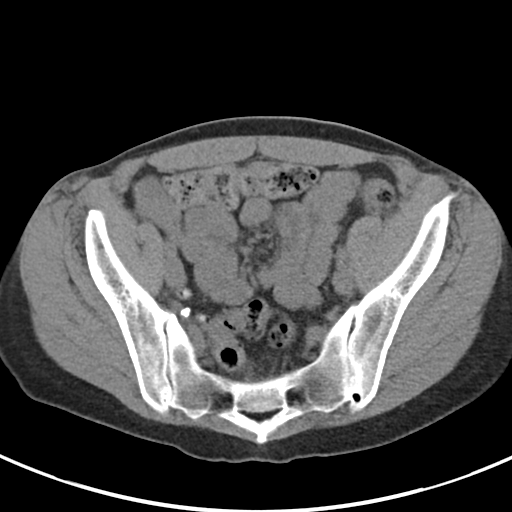
[im 34/81  soft-tissue]
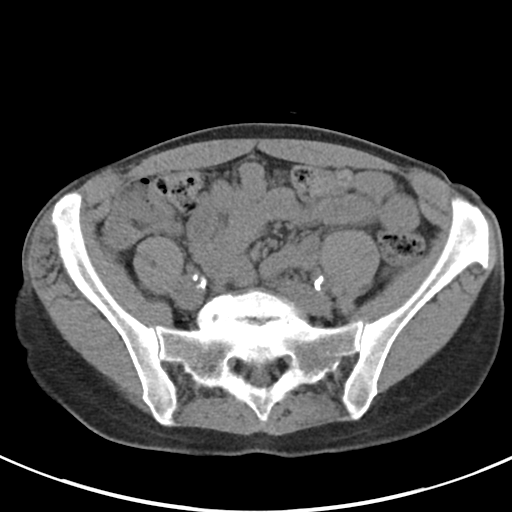
[im 41/81  soft-tissue]
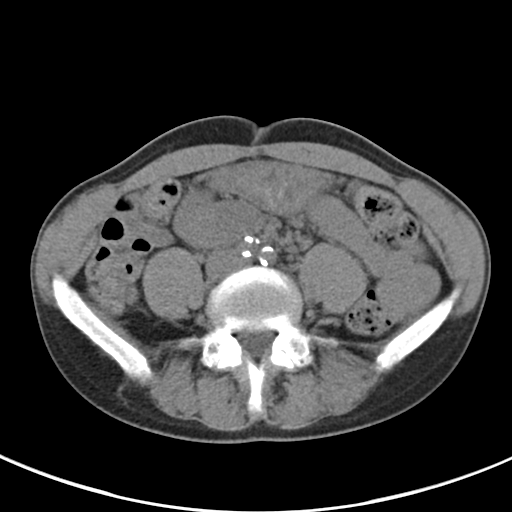
[im 47/81  soft-tissue]
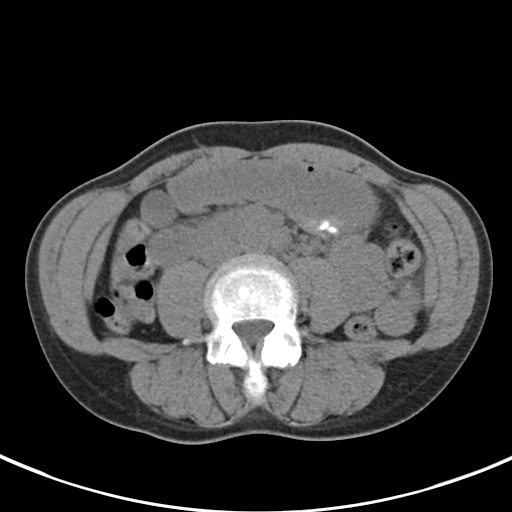
[im 53/81  soft-tissue]
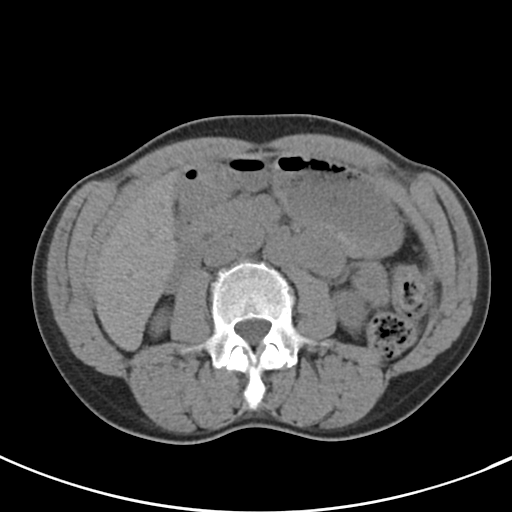
[im 53/81  bone]
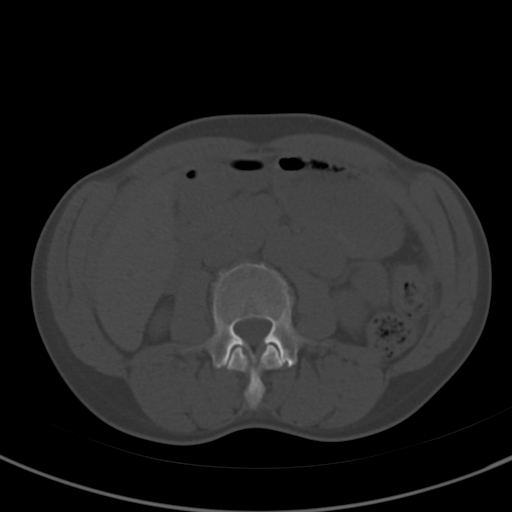
[im 59/81  soft-tissue]
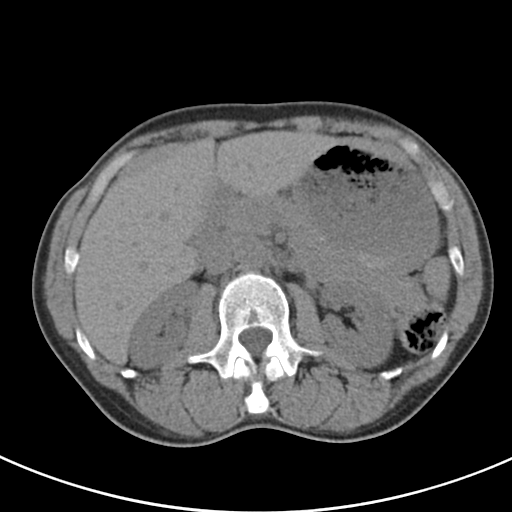
[im 65/81  soft-tissue]
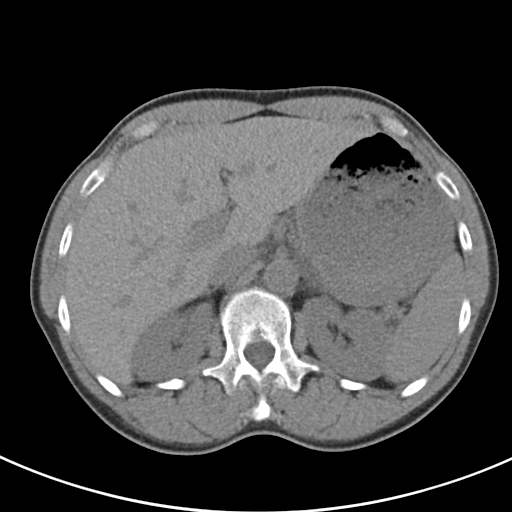
[im 71/81  soft-tissue]
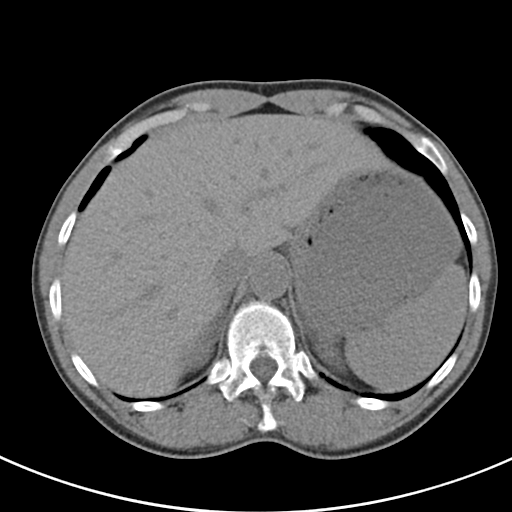
[im 77/81  soft-tissue]
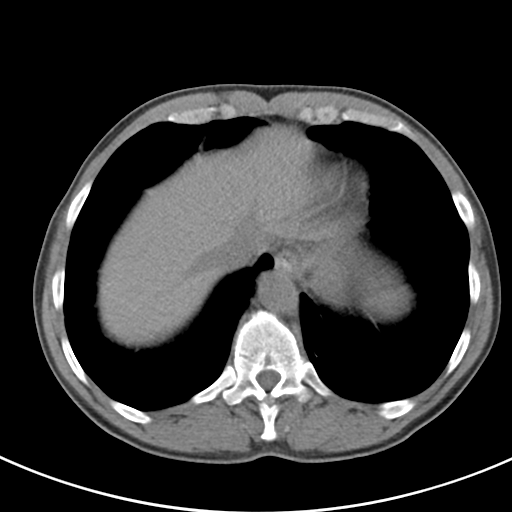

[Series 5: coronal soft tissue · coronal · 0.59mm/px · 3 of 67 slices shown]
[im 23/67  soft-tissue]
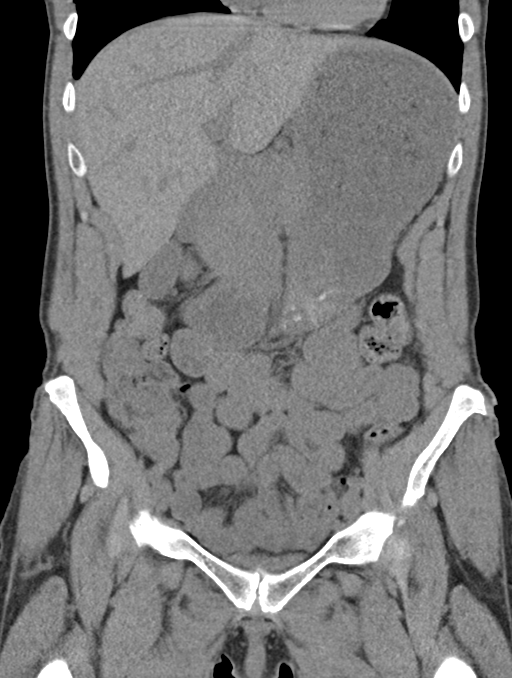
[im 30/67  soft-tissue]
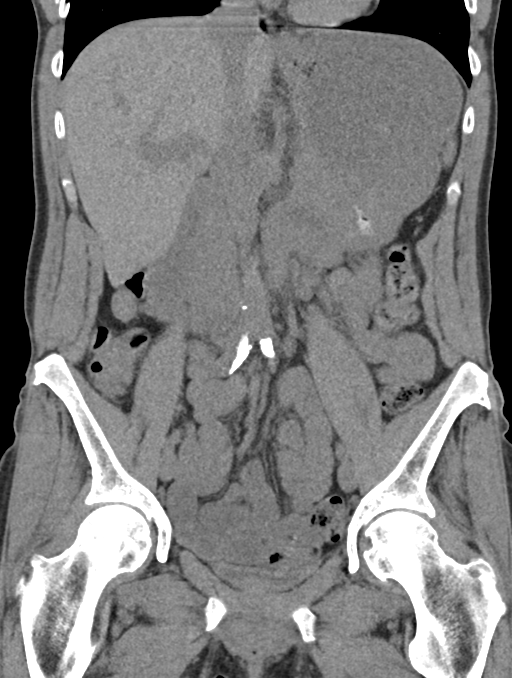
[im 37/67  soft-tissue]
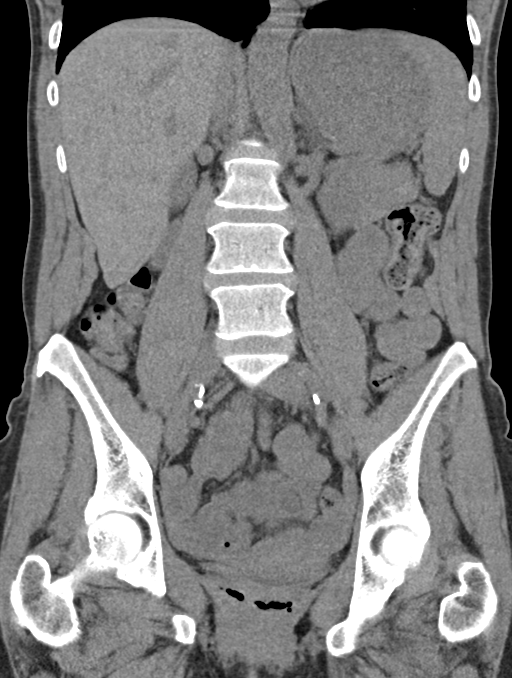

[16 of 46 positions shown; findings below may reference images not displayed]

FINDINGS: Lower chest: RIGHT middle lobe and lingular atelectasis. No pleural
effusion.

Hepatobiliary: No suspicious focal lesion identified within the
limitations of this exam. Gallbladder is unremarkable.

Pancreas: Unremarkable noncontrast appearance.

Spleen: Normal in size without focal abnormality.

Adrenals/Urinary Tract: Adrenal glands are unremarkable. Kidneys are
normal, without renal calculi or hydronephrosis. Bladder is
unremarkable.

Stomach/Bowel: Stomach is within normal limits. Appendix appears
normal. No evidence of bowel wall thickening, distention, or
inflammatory changes.

Vascular/Lymphatic: Aortic atherosclerosis, mild. No enlarged
abdominal or pelvic lymph nodes.

Reproductive: Uterus and bilateral adnexa are unremarkable.

Other: No abdominal wall hernia or abnormality. No abdominopelvic
ascites.

Musculoskeletal: Degenerative changes of L5-S1.
IMPRESSION: 1. No acute intra-abdominal or pelvic findings. Specifically, no
evidence of obstructive uropathy.

Aortic Atherosclerosis ([id number]).

## 2023-04-15 DIAGNOSIS — J101 Influenza due to other identified influenza virus with other respiratory manifestations: Secondary | ICD-10-CM | POA: Diagnosis not present

## 2023-08-14 DIAGNOSIS — M5432 Sciatica, left side: Secondary | ICD-10-CM | POA: Diagnosis not present
